# Patient Record
Sex: Female | Born: 1990 | Race: White | Hispanic: No | Marital: Married | Smoking: Former smoker
Health system: Southern US, Community
[De-identification: ages and names within clinical notes are randomized; demographics above are authoritative.]

## PROBLEM LIST (undated history)

## (undated) ENCOUNTER — Inpatient Hospital Stay (HOSPITAL_COMMUNITY): Payer: Self-pay

## (undated) DIAGNOSIS — Z975 Presence of (intrauterine) contraceptive device: Secondary | ICD-10-CM

## (undated) DIAGNOSIS — N926 Irregular menstruation, unspecified: Principal | ICD-10-CM

## (undated) DIAGNOSIS — M329 Systemic lupus erythematosus, unspecified: Secondary | ICD-10-CM

## (undated) DIAGNOSIS — R109 Unspecified abdominal pain: Principal | ICD-10-CM

## (undated) DIAGNOSIS — E7800 Pure hypercholesterolemia, unspecified: Secondary | ICD-10-CM

## (undated) DIAGNOSIS — M069 Rheumatoid arthritis, unspecified: Secondary | ICD-10-CM

## (undated) DIAGNOSIS — Z3490 Encounter for supervision of normal pregnancy, unspecified, unspecified trimester: Principal | ICD-10-CM

## (undated) DIAGNOSIS — K5900 Constipation, unspecified: Secondary | ICD-10-CM

## (undated) DIAGNOSIS — K219 Gastro-esophageal reflux disease without esophagitis: Secondary | ICD-10-CM

## (undated) DIAGNOSIS — M1990 Unspecified osteoarthritis, unspecified site: Secondary | ICD-10-CM

## (undated) DIAGNOSIS — IMO0002 Reserved for concepts with insufficient information to code with codable children: Secondary | ICD-10-CM

## (undated) HISTORY — DX: Constipation, unspecified: K59.00

## (undated) HISTORY — DX: Encounter for supervision of normal pregnancy, unspecified, unspecified trimester: Z34.90

## (undated) HISTORY — DX: Presence of (intrauterine) contraceptive device: Z97.5

## (undated) HISTORY — DX: Unspecified abdominal pain: R10.9

## (undated) HISTORY — DX: Irregular menstruation, unspecified: N92.6

## (undated) HISTORY — DX: Reserved for concepts with insufficient information to code with codable children: IMO0002

## (undated) HISTORY — DX: Gastro-esophageal reflux disease without esophagitis: K21.9

## (undated) HISTORY — DX: Unspecified osteoarthritis, unspecified site: M19.90

## (undated) HISTORY — DX: Pure hypercholesterolemia, unspecified: E78.00

## (undated) HISTORY — DX: Systemic lupus erythematosus, unspecified: M32.9

## (undated) HISTORY — DX: Rheumatoid arthritis, unspecified: M06.9

---

## 2010-04-17 ENCOUNTER — Ambulatory Visit (HOSPITAL_COMMUNITY): Admission: RE | Admit: 2010-04-17 | Discharge: 2010-04-17 | Payer: Self-pay | Admitting: Family Medicine

## 2010-12-22 ENCOUNTER — Encounter: Payer: Self-pay | Admitting: Internal Medicine

## 2011-04-16 ENCOUNTER — Other Ambulatory Visit: Payer: Self-pay | Admitting: Obstetrics & Gynecology

## 2011-04-16 DIAGNOSIS — N644 Mastodynia: Secondary | ICD-10-CM

## 2011-04-16 DIAGNOSIS — N6320 Unspecified lump in the left breast, unspecified quadrant: Secondary | ICD-10-CM

## 2011-04-23 ENCOUNTER — Ambulatory Visit (HOSPITAL_COMMUNITY)
Admission: RE | Admit: 2011-04-23 | Discharge: 2011-04-23 | Disposition: A | Discharge status: Home / Self Care | Payer: BC Managed Care – PPO | Source: Ambulatory Visit | Attending: Obstetrics & Gynecology | Admitting: Obstetrics & Gynecology

## 2011-04-23 DIAGNOSIS — N644 Mastodynia: Secondary | ICD-10-CM

## 2011-04-23 DIAGNOSIS — N6320 Unspecified lump in the left breast, unspecified quadrant: Secondary | ICD-10-CM

## 2011-04-23 DIAGNOSIS — N63 Unspecified lump in unspecified breast: Secondary | ICD-10-CM | POA: Insufficient documentation

## 2011-10-13 ENCOUNTER — Encounter (HOSPITAL_COMMUNITY): Payer: Self-pay | Admitting: Pharmacy Technician

## 2011-10-13 ENCOUNTER — Encounter (HOSPITAL_COMMUNITY): Payer: Self-pay

## 2011-10-13 ENCOUNTER — Encounter (HOSPITAL_COMMUNITY)
Admission: RE | Admit: 2011-10-13 | Discharge: 2011-10-13 | Disposition: A | Discharge status: Home / Self Care | Payer: BC Managed Care – PPO | Source: Ambulatory Visit | Attending: General Surgery | Admitting: General Surgery

## 2011-10-13 LAB — SURGICAL PCR SCREEN: Staphylococcus aureus: NEGATIVE

## 2011-10-13 LAB — CBC
Hemoglobin: 12.6 g/dL (ref 12.0–15.0)
MCH: 28.8 pg (ref 26.0–34.0)
Platelets: 243 10*3/uL (ref 150–400)
RBC: 4.37 MIL/uL (ref 3.87–5.11)
RDW: 12.5 % (ref 11.5–15.5)

## 2011-10-13 LAB — HCG, QUANTITATIVE, PREGNANCY: hCG, Beta Chain, Quant, S: <1 m[IU]/mL (ref <5)

## 2011-10-13 LAB — BASIC METABOLIC PANEL
BUN: 9 mg/dL (ref 6–23)
CO2: 27 mEq/L (ref 19–32)
Calcium: 10.2 mg/dL (ref 8.4–10.5)
Creatinine, Ser: 0.77 mg/dL (ref 0.50–1.10)
Sodium: 137 mEq/L (ref 135–145)

## 2011-10-13 LAB — DIFFERENTIAL
Basophils Absolute: 0 10*3/uL (ref 0.0–0.1)
Eosinophils Absolute: 0.1 10*3/uL (ref 0.0–0.7)
Eosinophils Relative: 2 % (ref 0–5)
Lymphs Abs: 2 10*3/uL (ref 0.7–4.0)
Monocytes Absolute: 0.4 10*3/uL (ref 0.1–1.0)
Monocytes Relative: 8 % (ref 3–12)

## 2011-10-13 NOTE — Patient Instructions (Addendum)
20 Sydney Trujillo  10/13/2011   Your procedure is scheduled on:  10/17/2011  Report to [facility] at  615  AM.  Call this number if you have problems the morning of surgery: [phone]   Remember:   Do not eat food:After Midnight.  Do not drink clear liquids: After Midnight.  Take these medicines the morning of surgery with A SIP OF WATER: none   Do not wear jewelry, make-up or nail polish.  Do not wear lotions, powders, or perfumes. You may wear deodorant.  Do not shave 48 hours prior to surgery.  Do not bring valuables to the hospital.  Contacts, dentures or bridgework may not be worn into surgery.  Leave suitcase in the car. After surgery it may be brought to your room.  For patients admitted to the hospital, checkout time is 11:00 AM the day of discharge.   Patients discharged the day of surgery will not be allowed to drive home.  Name and phone number of your driver: family  Special Instructions: CHG Shower Use Special Wash: 1/2 bottle night before surgery and 1/2 bottle morning of surgery.   Please read over the following fact sheets that you were given: Pain Booklet, MRSA Information, Surgical Site Infection Prevention, Anesthesia Post-op Instructions and Care and Recovery After Surgery Breast Biopsy WHY YOU NEED A BIOPSY Your caregiver has recommended that you have a breast tissue sample taken (biopsy). This is done to be certain that the lump or abnormality found in your breast is not cancerous (malignant). During a biopsy, a small piece of tissue is removed, so it can be examined under a microscope by a specialist (pathologist) who looks at tissues and cells and diagnoses abnormalities in them. Most lumps (tumors) or abnormalities, on or in the breast, are not cancerous (benign). However, biopsies are taken when your caregiver cannot be absolutely certain of what is wrong only from doing a physical exam, mammogram (breast X-ray), or other studies. A breast biopsy can tell you  whether nothing more needs to be done, or you need more surgery or another type of treatment. A biopsy is done when there is:  Any undiagnosed breast mass.   Nipple abnormalities, dimpling, crusting, or ulcerations.   Calcium deposits (calcifications) or abnormalities seen on your mammogram, ultrasound, or MRI.   Suspicious changes in the breast (thickening, asymmetry) seen on mammogram.   Abnormal discharge from the nipple, especially blood.   Redness, swelling, and pain of the breast.  HOW A BIOPSY IS PERFORMED A biopsy is often performed on an outpatient basis (you go home the same day). This can be done in a hospital, clinic, or surgical center. Tissue samples (biopsies) are often done under local anesthesia (area is numbed). Sometimes general anesthetics are required, in which case you sleep through the procedure. Biopsies may remove the entire lump, a small piece of the lump, or a small sliver of tissue removed by needle. TYPES OF BREAST BIOPSY  Fine needle aspiration. A thin needle is placed through the skin, to the lump or cyst, and cells are removed.   Core needle biopsy. A large needle with a special tip is placed through the skin, to the abnormality, and a piece of tissue is removed.   Stereotactic biopsy. A core needle with a special X-ray is used, to direct the needle to the lump or abnormal area, which is difficult to feel or cannot be felt.   Vacuum-assisted biopsy. A hollow probe and a gentle vacuum remove a  sample of tissue.   Ultrasound guided core needle biopsy. You lie on your stomach, with your breast through an opening, and a high frequency ultrasound helps guide the needle to the area of the abnormality.   Open biopsy. An incision is made in the breast, and a piece of the lump or the whole lump is removed.  LET YOUR CAREGIVER KNOW ABOUT:  Allergies.   Medicines taken, including herbs, eye drops, over-the-counter medicines, and creams.   Use of steroids (by  mouth or creams).   Previous problems with anesthetics or Novocaine.   If you are taking aspirin or blood thinners.   Possibility of pregnancy, if this applies.   History of blood clots (thrombophlebitis).   History of bleeding or blood problems.   Previous surgery.   Other health problems.  RISKS AND COMPLICATIONS   Bleeding.   Infection.   Allergy to medicines.   Bruising and swelling of the breast.   Alteration in the shape of the breast.   Not finding the lump or abnormality.   Needing more surgery.  BEFORE THE PROCEDURE  You should arrive 60 minutes prior to your procedure or as directed.   Check-in at the admissions desk, to fill out necessary forms, if you are not preregistered.   There will be consent forms to sign, prior to the procedure.   There is a waiting area for your family, while you are having your biopsy.   Try to have someone with you, to drive you home.   Do not smoke for 2 weeks before the surgery.   Let your caregiver know if you develop a cold or an infection.   Do not drink alcohol for at least 24 hours before surgery.   Wear a good support bra to the surgery.  AFTER THE PROCEDURE  After surgery, you will be taken to the recovery area, where a nurse will watch and check your progress. Once you are awake, stable, and taking fluids well, if there are no other problems, you will be allowed to go home.   Ice packs applied to your operative site may help with discomfort and keep the swelling down.   You may resume normal diet and activities as directed. Avoid strenuous activities affecting the arm on the side of the biopsy, such as tennis, swimming, heavy lifting (more than 10 pounds) or pulling.   Bruising in the breast is normal following this procedure.   Wearing a support bra, even to bed, may be more comfortable. The bra will also help keep the dressing on.   Change dressings as directed.   Your doctor may apply a pressure  dressing on your breast for 24 to 48 hours.   Only take over-the-counter or prescription medicines for pain, discomfort, or fever as directed by your caregiver.   Do not take aspirin, because it can cause bleeding.  HOME CARE INSTRUCTIONS   You may resume your usual diet.   Have someone drive you home after the surgery.   Do not do any exercise, driving, lifting or general activities without your caregiver's permission.   Take medicines and over-the-counter medicines, as ordered by your caregiver.   Keep your postoperative appointments as recommended.   Do not drink alcohol while taking pain medicine.  Finding out the results of your test Not all test results are available during your visit. If your test results are not back during the visit, make an appointment with your caregiver to find out the results. Do not  assume everything is normal if you have not heard from your caregiver or the medical facility. It is important for you to follow up on all of your test results.  SEEK MEDICAL CARE IF:   You notice redness, swelling, or increasing pain in the wound.   You notice a bad smell coming from the wound or dressing.   You develop a rash.   You need stronger pain medicine.   You are having an allergic reaction or problems with your medicines.  SEEK IMMEDIATE MEDICAL CARE IF:   You have difficulty breathing.   You have a fever.   There is increased bleeding (more than a small spot) from the wound.   Pus is coming from the wound.   The wound is breaking open.  Document Released: 11/17/2005 Document Revised: 07/30/2011 Document Reviewed: 10/05/2009 [facility] Patient Information 2012 [place], [place].PATIENT INSTRUCTIONS POST-ANESTHESIA  IMMEDIATELY FOLLOWING SURGERY:  Do not drive or operate machinery for the first twenty four hours after surgery.  Do not make any important decisions for twenty four hours after surgery or while taking narcotic pain medications or sedatives.   If you develop intractable nausea and vomiting or a severe headache please notify your doctor immediately.  FOLLOW-UP:  Please make an appointment with your surgeon as instructed. You do not need to follow up with anesthesia unless specifically instructed to do so.  WOUND CARE INSTRUCTIONS (if applicable):  Keep a dry clean dressing on the anesthesia/puncture wound site if there is drainage.  Once the wound has quit draining you may leave it open to air.  Generally you should leave the bandage intact for twenty four hours unless there is drainage.  If the epidural site drains for more than 36-48 hours please call the anesthesia department.  QUESTIONS?:  Please feel free to call your physician or the hospital operator if you have any questions, and they will be happy to assist you.     [facility] Anesthesia Department [address] [place] [phone]

## 2011-10-16 ENCOUNTER — Encounter (HOSPITAL_COMMUNITY): Payer: Self-pay | Admitting: Pharmacy Technician

## 2011-10-16 NOTE — H&P (Signed)
  NTS SOAP Note  Vital Signs:  Vitals as of: 09/18/2011: Systolic 130: Diastolic 83: Heart Rate 84: Temp 99.30F: Height 61ft 2in: Weight 145Lbs 0 Ounces: Pain Level 0: BMI 27  BMI : 26.52 kg/m2  Subjective: This 20 Years 7 Months old Female presents for of Left breast mass  Patient has noted a mass within the left breast over the last several months. This has increased somewhat in size. She occasionally has discomfort in this area. She has not had any nipple discharge or skin changes. No other nodules or abnormalities are noted in the left or right breast. She's not had any fevers or chills. No weight changes. She has undergone both mammography and ultrasound of the breast. She remains concerned due to the size change.  Review of Symptoms:  Constitutional:unremarkable Head:unremarkable Eyes:unremarkable Nose/Mouth/Throat:unremarkable Cardiovascular:unremarkable Respiratory:unremarkable Gastrointestinal:unremarkable Genitourinary:unremarkable Musculoskeletal:unremarkable Skin:unremarkable As per history of present illness Hematolgic/Lymphatic:unremarkable Allergic/Immunologic:unremarkable   Past Medical History:Obtained   Past Medical History  Pregnancy Gravida: 0 Pregnancy Para: 0 Surgical History: None Medical Problems: none Psychiatric History: none Allergies: nkda Medications: none   Social History:Obtained   Social History  Preferred Language: English ([place]) Race:  White Ethnicity: Not Hispanic / Latino Age: 20 Years 7 Months Marital Status:  M Alcohol:  No Recreational drug(s):  No   Smoking Status: Never smoker reviewed on 09/21/2011  Family History:Obtained   Family History  Is there a family history JY:NWGNFAO does have an aunt with a history of breast cancer at the age of 44. No ovarian or uterine cancers in the family.    Objective Information: General:Well appearing, well nourished in no  distress. Skin:no rash or prominent lesions Head:Atraumatic; no masses; no abnormalities Eyes:conjunctiva clear, EOM intact, PERRL Mouth:Mucous membranes moist, no mucosal lesions. Neck:Supple without lymphadenopathy.  Heart:RRR, no murmur Lungs:CTA bilaterally, no wheezes, rhonchi, rales.  Breathing unlabored. Right breast unremarkable, left breast there is a palpable mobile mildly tender mass at the 7:00 position no nipple discharge or skin changes. No axillary lymphadenopathy Abdomen:Soft, NT/ND, no HSM, no masses. Extremities:No deformities, clubbing, cyanosis, or edema.   Assessment:  Diagnosis &amp; Procedure: DiagnosisCode: 611.72, ProcedureCode: 13086,    Plan: Options were discussed at length the patient. Patient does wish to proceed with an excisional biopsy. Risks benefits and alternatives were discussed. We will plan to proceed at her earliest convenience.  Patient Education:Alternative treatments to surgery were discussed with patient (and family).Risks and benefits  of procedure were fully explained to the patient (and family) who gave informed consent. Patient/family questions were addressed.  Follow-up:Pending Surgery

## 2011-10-17 ENCOUNTER — Encounter (HOSPITAL_COMMUNITY): Payer: Self-pay | Admitting: *Deleted

## 2011-10-17 ENCOUNTER — Encounter (HOSPITAL_COMMUNITY): Payer: Self-pay | Admitting: Anesthesiology

## 2011-10-17 ENCOUNTER — Other Ambulatory Visit: Payer: Self-pay | Admitting: General Surgery

## 2011-10-17 ENCOUNTER — Ambulatory Visit (HOSPITAL_COMMUNITY): Payer: BC Managed Care – PPO | Admitting: Anesthesiology

## 2011-10-17 ENCOUNTER — Encounter (HOSPITAL_COMMUNITY)
Admission: RE | Disposition: A | Discharge status: Home / Self Care | Payer: Self-pay | Source: Ambulatory Visit | Attending: General Surgery

## 2011-10-17 ENCOUNTER — Ambulatory Visit (HOSPITAL_COMMUNITY)
Admission: RE | Admit: 2011-10-17 | Discharge: 2011-10-17 | Disposition: A | Discharge status: Home / Self Care | Payer: BC Managed Care – PPO | Source: Ambulatory Visit | Attending: General Surgery | Admitting: General Surgery

## 2011-10-17 DIAGNOSIS — Z01812 Encounter for preprocedural laboratory examination: Secondary | ICD-10-CM | POA: Insufficient documentation

## 2011-10-17 DIAGNOSIS — N6320 Unspecified lump in the left breast, unspecified quadrant: Secondary | ICD-10-CM

## 2011-10-17 DIAGNOSIS — N6009 Solitary cyst of unspecified breast: Secondary | ICD-10-CM | POA: Insufficient documentation

## 2011-10-17 DIAGNOSIS — N6029 Fibroadenosis of unspecified breast: Secondary | ICD-10-CM | POA: Insufficient documentation

## 2011-10-17 DIAGNOSIS — D249 Benign neoplasm of unspecified breast: Secondary | ICD-10-CM | POA: Insufficient documentation

## 2011-10-17 HISTORY — PX: BREAST BIOPSY: SHX20

## 2011-10-17 SURGERY — BREAST BIOPSY
Anesthesia: General | Site: Breast | Laterality: Left | Wound class: Clean

## 2011-10-17 MED ORDER — LIDOCAINE HCL (PF) 1 % IJ SOLN
INTRAMUSCULAR | Status: AC
  Filled 2011-10-17: qty 5

## 2011-10-17 MED ORDER — PROPOFOL 10 MG/ML IV EMUL
INTRAVENOUS | Status: DC | PRN
  Administered 2011-10-17: 20 mg via INTRAVENOUS
  Administered 2011-10-17: 30 mg via INTRAVENOUS
  Administered 2011-10-17: 150 mg via INTRAVENOUS

## 2011-10-17 MED ORDER — PROPOFOL 10 MG/ML IV EMUL
INTRAVENOUS | Status: AC
  Filled 2011-10-17: qty 20

## 2011-10-17 MED ORDER — CEFAZOLIN SODIUM 1-5 GM-% IV SOLN
1.0000 g | INTRAVENOUS | Status: AC
  Administered 2011-10-17: 1 g via INTRAVENOUS

## 2011-10-17 MED ORDER — MIDAZOLAM HCL 2 MG/2ML IJ SOLN
1.0000 mg | INTRAMUSCULAR | Status: DC | PRN
  Administered 2011-10-17: 2 mg via INTRAVENOUS

## 2011-10-17 MED ORDER — GLYCOPYRROLATE 0.2 MG/ML IJ SOLN
0.2000 mg | Freq: Once | INTRAMUSCULAR | Status: AC | PRN
  Administered 2011-10-17: 0.2 mg via INTRAVENOUS

## 2011-10-17 MED ORDER — ENOXAPARIN SODIUM 40 MG/0.4ML ~~LOC~~ SOLN
40.0000 mg | Freq: Once | SUBCUTANEOUS | Status: AC
  Administered 2011-10-17: 40 mg via SUBCUTANEOUS

## 2011-10-17 MED ORDER — ENOXAPARIN SODIUM 40 MG/0.4ML ~~LOC~~ SOLN
SUBCUTANEOUS | Status: AC
  Administered 2011-10-17: 40 mg via SUBCUTANEOUS
  Filled 2011-10-17: qty 0.4

## 2011-10-17 MED ORDER — SODIUM CHLORIDE 0.9 % IR SOLN
Status: DC | PRN
  Administered 2011-10-17: 1000 mL

## 2011-10-17 MED ORDER — LACTATED RINGERS IV SOLN
INTRAVENOUS | Status: DC
Start: 2011-10-17 — End: 2011-10-17
  Administered 2011-10-17: 1000 mL via INTRAVENOUS

## 2011-10-17 MED ORDER — FENTANYL CITRATE 0.05 MG/ML IJ SOLN
INTRAMUSCULAR | Status: DC | PRN
  Administered 2011-10-17 (×2): 50 ug via INTRAVENOUS
  Administered 2011-10-17: 25 ug via INTRAVENOUS

## 2011-10-17 MED ORDER — FENTANYL CITRATE 0.05 MG/ML IJ SOLN
INTRAMUSCULAR | Status: AC
  Filled 2011-10-17: qty 2

## 2011-10-17 MED ORDER — ONDANSETRON HCL 4 MG/2ML IJ SOLN
4.0000 mg | Freq: Once | INTRAMUSCULAR | Status: AC
  Administered 2011-10-17: 4 mg via INTRAVENOUS

## 2011-10-17 MED ORDER — FENTANYL CITRATE 0.05 MG/ML IJ SOLN: 25.0000 ug | INTRAMUSCULAR | Status: DC | PRN

## 2011-10-17 MED ORDER — ACETAMINOPHEN 325 MG PO TABS: 325.0000 mg | ORAL_TABLET | ORAL | Status: DC | PRN

## 2011-10-17 MED ORDER — ONDANSETRON HCL 4 MG/2ML IJ SOLN: 4.0000 mg | Freq: Once | INTRAMUSCULAR | Status: DC | PRN

## 2011-10-17 MED ORDER — BUPIVACAINE HCL (PF) 0.5 % IJ SOLN
INTRAMUSCULAR | Status: DC | PRN
  Administered 2011-10-17: 10 mL

## 2011-10-17 MED ORDER — GLYCOPYRROLATE 0.2 MG/ML IJ SOLN
INTRAMUSCULAR | Status: AC
  Administered 2011-10-17: 0.2 mg via INTRAVENOUS
  Filled 2011-10-17: qty 1

## 2011-10-17 MED ORDER — MIDAZOLAM HCL 2 MG/2ML IJ SOLN
INTRAMUSCULAR | Status: AC
  Administered 2011-10-17: 2 mg via INTRAVENOUS
  Filled 2011-10-17: qty 2

## 2011-10-17 MED ORDER — BUPIVACAINE HCL (PF) 0.5 % IJ SOLN
INTRAMUSCULAR | Status: AC
  Filled 2011-10-17: qty 30

## 2011-10-17 MED ORDER — CELECOXIB 100 MG PO CAPS: 400.0000 mg | ORAL_CAPSULE | Freq: Every day | ORAL | Status: DC

## 2011-10-17 MED ORDER — CEFAZOLIN SODIUM 1-5 GM-% IV SOLN
INTRAVENOUS | Status: AC
  Filled 2011-10-17: qty 50

## 2011-10-17 MED ORDER — ONDANSETRON HCL 4 MG/2ML IJ SOLN
INTRAMUSCULAR | Status: AC
  Administered 2011-10-17: 4 mg via INTRAVENOUS
  Filled 2011-10-17: qty 2

## 2011-10-17 MED ORDER — HYDROCODONE-ACETAMINOPHEN 5-325 MG PO TABS: 1.0000 | ORAL_TABLET | ORAL | Status: AC | PRN

## 2011-10-17 SURGICAL SUPPLY — 29 items
BAG HAMPER (MISCELLANEOUS) ×2 IMPLANT
BENZOIN TINCTURE PRP APPL 2/3 (GAUZE/BANDAGES/DRESSINGS) ×2 IMPLANT
CLOTH BEACON ORANGE TIMEOUT ST (SAFETY) ×2 IMPLANT
COVER LIGHT HANDLE STERIS (MISCELLANEOUS) ×4 IMPLANT
DURAPREP 26ML APPLICATOR (WOUND CARE) ×2 IMPLANT
ELECT REM PT RETURN 9FT ADLT (ELECTROSURGICAL) ×2
ELECTRODE REM PT RTRN 9FT ADLT (ELECTROSURGICAL) ×1 IMPLANT
FORMALIN 10 PREFIL 120ML (MISCELLANEOUS) ×2 IMPLANT
GLOVE BIOGEL PI IND STRL 7.5 (GLOVE) ×1 IMPLANT
GLOVE BIOGEL PI INDICATOR 7.5 (GLOVE) ×1
GLOVE ECLIPSE 6.5 STRL STRAW (GLOVE) ×2 IMPLANT
GLOVE ECLIPSE 7.0 STRL STRAW (GLOVE) ×2 IMPLANT
GLOVE INDICATOR 7.0 STRL GRN (GLOVE) ×2 IMPLANT
GOWN STRL REIN XL XLG (GOWN DISPOSABLE) ×4 IMPLANT
KIT ROOM TURNOVER APOR (KITS) ×2 IMPLANT
MANIFOLD NEPTUNE II (INSTRUMENTS) ×2 IMPLANT
NEEDLE HYPO 18GX1.5 BLUNT FILL (NEEDLE) IMPLANT
NEEDLE HYPO 25X1 1.5 SAFETY (NEEDLE) ×2 IMPLANT
NS IRRIG 1000ML POUR BTL (IV SOLUTION) ×2 IMPLANT
PACK MINOR (CUSTOM PROCEDURE TRAY) ×2 IMPLANT
PAD ARMBOARD 7.5X6 YLW CONV (MISCELLANEOUS) ×2 IMPLANT
SET BASIN LINEN APH (SET/KITS/TRAYS/PACK) ×2 IMPLANT
SPONGE GAUZE 2X2 8PLY STRL LF (GAUZE/BANDAGES/DRESSINGS) IMPLANT
STRIP CLOSURE SKIN 1/2X4 (GAUZE/BANDAGES/DRESSINGS) ×2 IMPLANT
SUT MNCRL AB 4-0 PS2 18 (SUTURE) ×2 IMPLANT
SUT VIC AB 3-0 SH 27 (SUTURE) ×1
SUT VIC AB 3-0 SH 27X BRD (SUTURE) ×1 IMPLANT
SYR BULB IRRIGATION 50ML (SYRINGE) ×2 IMPLANT
SYR CONTROL 10ML LL (SYRINGE) ×2 IMPLANT

## 2011-10-17 NOTE — Anesthesia Preprocedure Evaluation (Signed)
Anesthesia Evaluation  Patient identified by MRN, date of birth, ID band Patient awake    Reviewed: Allergy & Precautions, H&P , NPO status , Patient's Chart, lab work & pertinent test results  Airway       Dental   Pulmonary neg pulmonary ROS,          Cardiovascular neg cardio ROS     Neuro/Psych Negative Neurological ROS  Negative Psych ROS   GI/Hepatic negative GI ROS, Neg liver ROS,   Endo/Other  Negative Endocrine ROS  Renal/GU negative Renal ROS     Musculoskeletal negative musculoskeletal ROS (+)   Abdominal   Peds  Hematology negative hematology ROS (+)   Anesthesia Other Findings   Reproductive/Obstetrics negative OB ROS                           Anesthesia Physical Anesthesia Plan  ASA: I  Anesthesia Plan: General   Post-op Pain Management:    Induction: Intravenous  Airway Management Planned: LMA  Additional Equipment:   Intra-op Plan:   Post-operative Plan: Extubation in OR  Informed Consent: I have reviewed the patients History and Physical, chart, labs and discussed the procedure including the risks, benefits and alternatives for the proposed anesthesia with the patient or authorized representative who has indicated his/her understanding and acceptance.     Plan Discussed with: CRNA  Anesthesia Plan Comments:         Anesthesia Quick Evaluation

## 2011-10-17 NOTE — Anesthesia Procedure Notes (Signed)
Procedure Name: LMA Insertion Date/Time: 10/17/2011 8:05 AM Performed by: Minerva Areola Pre-anesthesia Checklist: Patient identified, Patient being monitored, Emergency Drugs available, Timeout performed and Suction available Patient Re-evaluated:Patient Re-evaluated prior to inductionOxygen Delivery Method: Circle System Utilized Preoxygenation: Pre-oxygenation with 100% oxygen Intubation Type: IV induction Ventilation: Mask ventilation without difficulty LMA: LMA inserted LMA Size: 3.0 Number of attempts: 1 Placement Confirmation: positive ETCO2 and breath sounds checked- equal and bilateral

## 2011-10-17 NOTE — Anesthesia Postprocedure Evaluation (Signed)
Anesthesia Post Note  Patient: Sydney Trujillo  Procedure(s) Performed:  BREAST BIOPSY  Anesthesia type: General  Patient location: PACU  Post pain: Pain level controlled  Post assessment: Post-op Vital signs reviewed, Patient's Cardiovascular Status Stable, Respiratory Function Stable, Patent Airway, No signs of Nausea or vomiting and Pain level controlled  Last Vitals:  Filed Vitals:   10/17/11 0853  BP: 122/59  Pulse: 90  Temp: 36.8 C  Resp: 12    Post vital signs: Reviewed and stable  Level of consciousness: awake and alert   Complications: No apparent anesthesia complications

## 2011-10-17 NOTE — Op Note (Signed)
..  Patient:  Sydney Trujillo  DOB:  1991-09-22  MRN:  [id number]   Preop Diagnosis:  Left breast mass  Postop Diagnosis:  The same  Procedure:  Left breast excisional biopsy  Surgeon:  Dr. Tilford Pillar  Anes:  General endotracheal  Indications:  Patient is a 20 year old female presented to my office with a history of a left breast mass. This had slowly increased in size. Due to the changes patient has been concerned about this mass and discussion about an excisional biopsy was undertaken. Risks benefits alternatives of biopsy were discussed including but not limited to risk of bleeding, infection, skin dehiscence as well as possible need for additional surgeries. Her questions and concerns were addressed the patient was consented for the planned procedure.  Procedure note:  Patient is taken to the or was placed in supine position on the or table. At this point the general anesthetic was administered and the patient was endotracheally intubated by the nurse anesthetist. At this point her left breast was prepped with DuraPrep solution and draped in standard fashion. A semilunar incision was created along the medial and inferior aspect of the areola complex. Additional dissection down to subcuticular tissues carried out using electrocautery. This dissection was carried out down to the palpable mass. An Allis clamp was utilized to grasp the mass and with this up to the field. A letter cautery was used to circumferentially dissect around the palpable mass. Once free was placed on the back table and sent as a permanent specimen to pathology. At this point hemostasis was excellent having been attainable electrocautery. The wound is irrigated with sterile saline. A 3-0 Vicryl was utilized to reapproximate the deep subcuticular tissue. Local anesthetic was instilled. A 4-0 Monocryl was utilized reapproximate the skin edges in a running subcuticular suture. Skin was washed dried moist dry towel. Benzoin is  applied around incision. Half-inch are suture placed. The drapes removed the patient was allowed to come out of general anesthetic was transferred back to the postanesthetic care unit in stable condition. At the conclusion of procedure all instrument, sponge, needle counts are correct. Patient tolerated procedure well.  Complications:  None  EBL:  Minimal  Specimen: Breast mass

## 2011-10-17 NOTE — Transfer of Care (Signed)
Immediate Anesthesia Transfer of Care Note  Patient: Sydney Trujillo  Procedure(s) Performed:  BREAST BIOPSY  Patient Location: PACU  Anesthesia Type: General  Level of Consciousness: awake  Airway & Oxygen Therapy: Patient Spontanous Breathing and non-rebreather face mask  Post-op Assessment: Report given to PACU RN, Post -op Vital signs reviewed and stable and Patient moving all extremities  Post vital signs: Reviewed and stable  Complications: No apparent anesthesia complications

## 2011-10-17 NOTE — Interval H&P Note (Signed)
History and Physical Interval Note:   10/17/2011   7:55 AM   Sydney Trujillo  has presented today for surgery, with the diagnosis of Breast mass [611.72]  The various methods of treatment have been discussed with the patient and family. After consideration of risks, benefits and other options for treatment, the patient has consented to  Procedure(s): BREAST BIOPSY as a surgical intervention .  The patients' history has been reviewed, patient examined, no change in status, stable for surgery.  I have reviewed the patients' chart and labs.  Questions were answered to the patient's satisfaction.     Fabio Bering  MD

## 2011-10-22 ENCOUNTER — Encounter (HOSPITAL_COMMUNITY): Payer: Self-pay | Admitting: General Surgery

## 2013-03-03 ENCOUNTER — Telehealth: Payer: Self-pay | Admitting: Adult Health

## 2013-03-03 NOTE — Telephone Encounter (Signed)
Pt states now taking megace 1 tablet a day, but continues to have bleeding. Please advise.

## 2013-03-03 NOTE — Telephone Encounter (Signed)
Left voice mail on cell to increase Megace to 2 a day

## 2013-08-08 ENCOUNTER — Ambulatory Visit (HOSPITAL_COMMUNITY)
Admission: RE | Admit: 2013-08-08 | Discharge: 2013-08-08 | Disposition: A | Discharge status: Home / Self Care | Payer: BC Managed Care – PPO | Source: Ambulatory Visit | Attending: Family Medicine | Admitting: Family Medicine

## 2013-08-08 ENCOUNTER — Other Ambulatory Visit (HOSPITAL_COMMUNITY): Payer: Self-pay | Admitting: Family Medicine

## 2013-08-08 DIAGNOSIS — R059 Cough, unspecified: Secondary | ICD-10-CM | POA: Insufficient documentation

## 2013-08-08 DIAGNOSIS — J069 Acute upper respiratory infection, unspecified: Secondary | ICD-10-CM

## 2013-08-08 DIAGNOSIS — R05 Cough: Secondary | ICD-10-CM | POA: Insufficient documentation

## 2013-08-25 ENCOUNTER — Telehealth: Payer: Self-pay | Admitting: *Deleted

## 2013-08-25 NOTE — Telephone Encounter (Signed)
Bleeding after sex for several years. Call transferred to front staff for an appt to be made with Cyril Mourning, NP

## 2013-08-30 ENCOUNTER — Encounter: Payer: Self-pay | Admitting: Adult Health

## 2013-08-30 ENCOUNTER — Ambulatory Visit (INDEPENDENT_AMBULATORY_CARE_PROVIDER_SITE_OTHER): Payer: BC Managed Care – PPO | Admitting: Adult Health

## 2013-08-30 VITALS — BP 130/80 | Ht 62.0 in | Wt 161.0 lb

## 2013-08-30 DIAGNOSIS — N926 Irregular menstruation, unspecified: Secondary | ICD-10-CM | POA: Insufficient documentation

## 2013-08-30 HISTORY — DX: Irregular menstruation, unspecified: N92.6

## 2013-08-30 LAB — CBC
Hemoglobin: 12.7 g/dL (ref 12.0–15.0)
MCH: 28.4 pg (ref 26.0–34.0)
MCV: 84.6 fL (ref 78.0–100.0)
RBC: 4.47 MIL/uL (ref 3.87–5.11)
WBC: 5.8 10*3/uL (ref 4.0–10.5)

## 2013-08-30 LAB — COMPREHENSIVE METABOLIC PANEL
CO2: 28 mEq/L (ref 19–32)
Calcium: 9.8 mg/dL (ref 8.4–10.5)
Chloride: 103 mEq/L (ref 96–112)
Glucose, Bld: 85 mg/dL (ref 70–99)
Sodium: 139 mEq/L (ref 135–145)
Total Bilirubin: 0.4 mg/dL (ref 0.3–1.2)
Total Protein: 7.1 g/dL (ref 6.0–8.3)

## 2013-08-30 MED ORDER — NORETHIN ACE-ETH ESTRAD-FE 1-20 MG-MCG(24) PO CHEW: 1.0000 | CHEWABLE_TABLET | Freq: Every day | ORAL | Status: DC

## 2013-08-30 NOTE — Progress Notes (Signed)
Subjective:     Patient ID: Sydney Trujillo, female   DOB: 25-Jun-1991, 22 y.o.   MRN: [id number]  HPI Tucker is in complaining of bleeding after sex and bleeding 3 out of 4 weeks has mirena IUD and she has some cramping.So has had weight gain.Has tried megace and doxycyline and anaprox with out relief.She had irregular bleeding with OCs years ago.   Review of Systems See  HPI Reviewed past medical,surgical, social and family history. Reviewed medications and allergies.     Objective:   Physical Exam BP 130/80  Ht 5\' 2"  (1.575 m)  Wt 161 lb (73.029 kg)  BMI 29.44 kg/m2  LMP 08/08/2013   on exam IUD strings are seen at os and she is a little tender over uterus,no masses felt no adnexal tenderness or mass felt no CMT, will try OCs Assessment:     Irregular bleeding   Plan:     Check CBC,CMP,TSH Start minastrin today 1 pack given Lot #[id number] A exp1/16   Follow up in 4 weeks

## 2013-08-30 NOTE — Patient Instructions (Addendum)
Start pills today Follow up in 4 weeks

## 2013-08-31 ENCOUNTER — Telehealth: Payer: Self-pay | Admitting: Adult Health

## 2013-08-31 LAB — TSH: TSH: 1.797 u[IU]/mL (ref 0.350–4.500)

## 2013-08-31 NOTE — Telephone Encounter (Signed)
Pt aware labs normal  

## 2013-09-06 ENCOUNTER — Telehealth: Payer: Self-pay | Admitting: Obstetrics and Gynecology

## 2013-09-06 NOTE — Telephone Encounter (Signed)
Pt states has mirena and birth control, not bleeding now but having a lot of abdominal and vaginal pain. Call transferred to front staff for an appt to be made.

## 2013-09-07 ENCOUNTER — Ambulatory Visit (INDEPENDENT_AMBULATORY_CARE_PROVIDER_SITE_OTHER): Payer: BC Managed Care – PPO | Admitting: Adult Health

## 2013-09-07 ENCOUNTER — Encounter: Payer: Self-pay | Admitting: Adult Health

## 2013-09-07 VITALS — BP 124/76 | Ht 62.0 in | Wt 164.0 lb

## 2013-09-07 DIAGNOSIS — R109 Unspecified abdominal pain: Secondary | ICD-10-CM

## 2013-09-07 HISTORY — DX: Unspecified abdominal pain: R10.9

## 2013-09-07 LAB — POCT URINALYSIS DIPSTICK
Glucose, UA: NEGATIVE
Leukocytes, UA: NEGATIVE
Nitrite, UA: NEGATIVE

## 2013-09-07 MED ORDER — IBUPROFEN 800 MG PO TABS: 800.0000 mg | ORAL_TABLET | Freq: Three times a day (TID) | ORAL | Status: DC | PRN

## 2013-09-07 NOTE — Patient Instructions (Signed)
Abdominal Pain Abdominal pain can be caused by many things. Your caregiver decides the seriousness of your pain by an examination and possibly blood tests and X-rays. Many cases can be observed and treated at home. Most abdominal pain is not caused by a disease and will probably improve without treatment. However, in many cases, more time must pass before a clear cause of the pain can be found. Before that point, it may not be known if you need more testing, or if hospitalization or surgery is needed. HOME CARE INSTRUCTIONS   Do not take laxatives unless directed by your caregiver.  Take pain medicine only as directed by your caregiver.  Only take over-the-counter or prescription medicines for pain, discomfort, or fever as directed by your caregiver.  Try a clear liquid diet (broth, tea, or water) for as long as directed by your caregiver. Slowly move to a bland diet as tolerated. SEEK IMMEDIATE MEDICAL CARE IF:   The pain does not go away.  You have a fever.  You keep throwing up (vomiting).  The pain is felt only in portions of the abdomen. Pain in the right side could possibly be appendicitis. In an adult, pain in the left lower portion of the abdomen could be colitis or diverticulitis.  You pass bloody or black tarry stools. MAKE SURE YOU:   Understand these instructions.  Will watch your condition.  Will get help right away if you are not doing well or get worse. Document Released: 08/27/2005 Document Revised: 02/09/2012 Document Reviewed: 07/05/2008 [facility] Patient Information 2014 [place], [place]. Follow up as scheduled

## 2013-09-07 NOTE — Progress Notes (Signed)
Subjective:     Patient ID: Sydney Trujillo, female   DOB: 07-Apr-1991, 22 y.o.   MRN: [id number]  HPI Sydney Trujillo is back complaining of abdominal pain and achy in vagina.Has IUD and has had irregular bleeding and started minastrin about 1 week age and has had no bleeding except some after sex.She denies nausea,vomiting,diarrhea or fever.  Review of Systems See HPI Reviewed past medical,surgical, social and family history. Reviewed medications and allergies.     Objective:   Physical Exam BP 124/76  Ht 5\' 2"  (1.575 m)  Wt 164 lb (74.39 kg)  BMI 29.99 kg/m2  LMP 08/08/2013   urine negative,    Skin warm and dry.Pelvic: external genitalia is normal in appearance, vagina: no disharge, cervix:smooth and bulbous,negative CMT. uterus: normal size, shape and contour,  tender, no masses felt, adnexa: no masses or tenderness noted. If this persists will remove IUD Assessment:     Abdominal pain    Plan:     Check CBC with diff,CMP, and ESR Rx Motrin 800 mg #60 1 every 8 hours prn pain with 1 refill Return as scheduled and continue OCs

## 2013-09-08 ENCOUNTER — Telehealth: Payer: Self-pay | Admitting: Adult Health

## 2013-09-08 LAB — COMPREHENSIVE METABOLIC PANEL
ALT: 12 U/L (ref 0–35)
Albumin: 4.3 g/dL (ref 3.5–5.2)
BUN: 10 mg/dL (ref 6–23)
CO2: 26 mEq/L (ref 19–32)
Calcium: 9.6 mg/dL (ref 8.4–10.5)
Chloride: 104 mEq/L (ref 96–112)
Creat: 0.66 mg/dL (ref 0.50–1.10)
Glucose, Bld: 75 mg/dL (ref 70–99)
Potassium: 4.5 mEq/L (ref 3.5–5.3)
Sodium: 136 mEq/L (ref 135–145)
Total Protein: 7 g/dL (ref 6.0–8.3)

## 2013-09-08 LAB — CBC WITH DIFFERENTIAL/PLATELET
Eosinophils Relative: 2 % (ref 0–5)
HCT: 37 % (ref 36.0–46.0)
Hemoglobin: 12.5 g/dL (ref 12.0–15.0)
Lymphocytes Relative: 34 % (ref 12–46)
Lymphs Abs: 2 10*3/uL (ref 0.7–4.0)
MCV: 86.2 fL (ref 78.0–100.0)
Monocytes Absolute: 0.4 10*3/uL (ref 0.1–1.0)
Monocytes Relative: 7 % (ref 3–12)
RBC: 4.29 MIL/uL (ref 3.87–5.11)
RDW: 14 % (ref 11.5–15.5)
WBC: 5.8 10*3/uL (ref 4.0–10.5)

## 2013-09-08 LAB — SEDIMENTATION RATE: Sed Rate: 5 mm/hr (ref 0–22)

## 2013-09-08 NOTE — Telephone Encounter (Signed)
Pt aware of labs  

## 2013-09-27 ENCOUNTER — Ambulatory Visit: Payer: BC Managed Care – PPO | Admitting: Adult Health

## 2013-10-18 ENCOUNTER — Ambulatory Visit (INDEPENDENT_AMBULATORY_CARE_PROVIDER_SITE_OTHER): Payer: BC Managed Care – PPO | Admitting: Adult Health

## 2013-10-18 ENCOUNTER — Encounter (INDEPENDENT_AMBULATORY_CARE_PROVIDER_SITE_OTHER): Payer: Self-pay

## 2013-10-18 ENCOUNTER — Encounter: Payer: Self-pay | Admitting: Adult Health

## 2013-10-18 VITALS — BP 114/64 | Ht 62.0 in | Wt 167.0 lb

## 2013-10-18 DIAGNOSIS — Z975 Presence of (intrauterine) contraceptive device: Secondary | ICD-10-CM

## 2013-10-18 DIAGNOSIS — N926 Irregular menstruation, unspecified: Secondary | ICD-10-CM

## 2013-10-18 HISTORY — DX: Presence of (intrauterine) contraceptive device: Z97.5

## 2013-10-18 MED ORDER — NORETHINDRONE-MESTRANOL 1-50 MG-MCG PO TABS: 1.0000 | ORAL_TABLET | Freq: Every day | ORAL | Status: DC

## 2013-10-18 NOTE — Patient Instructions (Signed)
Start necon 1/50 Follow up in 5 weeks

## 2013-10-18 NOTE — Progress Notes (Signed)
Subjective:     Patient ID: Sydney Trujillo, female   DOB: July 01, 1991, 22 y.o.   MRN: [id number]  HPI Sydney Trujillo is a 22 year old white female in complaining of still having irregular bleeding with IUD and bleeding after sex.Has been on minastrin and may be a little better.  Review of Systems See HPI Reviewed past medical,surgical, social and family history. Reviewed medications and allergies.     Objective:   Physical Exam BP 114/64  Ht 5\' 2"  (1.575 m)  Wt 167 lb (75.751 kg)  BMI 30.54 kg/m2  LMP 10/04/2013   talk only, still bleeding, maybe a little better, discussed with Dr Sydney Trujillo, will try Necon 1/50 as per his suggestion . Assessment:     Irregular bleeding and bleeding after sex IUD in place    Plan:    Follow up in 5 weeks Rx Necon 1/50 1 daily with 3 refills

## 2013-11-17 ENCOUNTER — Telehealth: Payer: Self-pay

## 2013-11-17 ENCOUNTER — Encounter: Payer: Self-pay | Admitting: Obstetrics & Gynecology

## 2013-11-17 ENCOUNTER — Telehealth: Payer: Self-pay | Admitting: Adult Health

## 2013-11-17 ENCOUNTER — Ambulatory Visit (INDEPENDENT_AMBULATORY_CARE_PROVIDER_SITE_OTHER): Payer: BC Managed Care – PPO | Admitting: Obstetrics & Gynecology

## 2013-11-17 ENCOUNTER — Encounter (INDEPENDENT_AMBULATORY_CARE_PROVIDER_SITE_OTHER): Payer: Self-pay

## 2013-11-17 VITALS — BP 138/70 | Wt 168.0 lb

## 2013-11-17 DIAGNOSIS — Z30432 Encounter for removal of intrauterine contraceptive device: Secondary | ICD-10-CM

## 2013-11-17 MED ORDER — NAPROXEN SODIUM 550 MG PO TABS: 550.0000 mg | ORAL_TABLET | Freq: Two times a day (BID) | ORAL | Status: DC

## 2013-11-17 NOTE — Telephone Encounter (Signed)
Complains of pain in uterus area,started last night has tired motrin 800 mg without relief to come in a t 4 pm

## 2013-11-17 NOTE — Telephone Encounter (Signed)
To come in at 4 pm

## 2013-11-17 NOTE — Progress Notes (Signed)
Patient ID: Sydney Trujillo, female   DOB: Apr 02, 1991, 22 y.o.   MRN: [id number] Pt has been having all sorts of problems for several months now related to mirena, always had bleeding troubles IUD removed and will maintain necon 1/50 Follow up in 3 weeks to see how her bleeding and pain is going  Past Medical History  Diagnosis Date  . Irregular bleeding 08/30/2013  . Abdominal pain 09/07/2013  . IUD (intrauterine device) in place 10/18/2013    07/2009 IUD    Past Surgical History  Procedure Laterality Date  . Breast biopsy  10/17/2011    Procedure: BREAST BIOPSY;  Surgeon: Fabio Bering;  Location: [facility];  Service: General;  Laterality: Left;    OB History   Grav Para Term Preterm Abortions TAB SAB Ect Mult Living                  Allergies  Allergen Reactions  . Latex Itching    History   Social History  . Marital Status: Married    Spouse Name: N/A    Number of Children: N/A  . Years of Education: N/A   Social History Main Topics  . Smoking status: Former Smoker -- 0.50 packs/day for 1 years    Types: Cigarettes    Quit date: 10/12/2009  . Smokeless tobacco: Never Used  . Alcohol Use: No  . Drug Use: No  . Sexual Activity: Yes    Birth Control/ Protection: IUD, Pill   Other Topics Concern  . None   Social History Narrative  . None    Family History  Problem Relation Age of Onset  . Anesthesia problems Neg Hx   . Hypotension Neg Hx   . Malignant hyperthermia Neg Hx   . Pseudochol deficiency Neg Hx   . Hypertension Mother   . Diabetes Father   . Hypertension Father

## 2013-11-22 ENCOUNTER — Ambulatory Visit: Payer: BC Managed Care – PPO | Admitting: Adult Health

## 2013-11-22 ENCOUNTER — Telehealth: Payer: Self-pay | Admitting: Obstetrics & Gynecology

## 2013-11-22 NOTE — Telephone Encounter (Signed)
Pt states had IUD removed on 11/17/2013 has been taking OCP for 1 month. Is it normal to have abnormal bleeding? Explained to pt can have irregular bleeding when switching from one birth control to another, continue to monitor if pt begins to have cramping or increased bleeding to call office back, if not keep her scheduled appt for 3 weeks. Pt verbalized understanding.

## 2013-12-08 ENCOUNTER — Ambulatory Visit (INDEPENDENT_AMBULATORY_CARE_PROVIDER_SITE_OTHER): Payer: BC Managed Care – PPO | Admitting: Obstetrics & Gynecology

## 2013-12-08 ENCOUNTER — Encounter: Payer: Self-pay | Admitting: Obstetrics & Gynecology

## 2013-12-08 VITALS — BP 124/80 | Ht 62.0 in | Wt 165.5 lb

## 2013-12-08 DIAGNOSIS — N925 Other specified irregular menstruation: Secondary | ICD-10-CM

## 2013-12-08 DIAGNOSIS — N949 Unspecified condition associated with female genital organs and menstrual cycle: Secondary | ICD-10-CM

## 2013-12-08 DIAGNOSIS — N938 Other specified abnormal uterine and vaginal bleeding: Secondary | ICD-10-CM

## 2013-12-08 NOTE — Progress Notes (Signed)
Patient ID: Sydney Trujillo, female   DOB: 01/02/1991, 23 y.o.   MRN: [id number]  See note below from last visit I removed IUD and started her on Neocon1/50 which has worked Recruitment consultant for her, about to have her first period Follow up 6 months for yearly exam and to maybe lower estrogen on pills   Previous visit: Pt has been having all sorts of problems for several months now related to mirena, always had bleeding troubles IUD removed and will maintain necon 1/50 Follow up in 3 weeks to see how her bleeding and pain is going    Past Medical History   Diagnosis  Date   .  Irregular bleeding  08/30/2013   .  Abdominal pain  09/07/2013   .  IUD (intrauterine device) in place  10/18/2013       07/2009 IUD         Past Surgical History   Procedure  Laterality  Date   .  Breast biopsy    10/17/2011       Procedure: BREAST BIOPSY;  Surgeon: Donato Heinz;  Location: [facility];  Service: General;  Laterality: Left;         OB History     Grav  Para  Term  Preterm  Abortions  TAB  SAB  Ect  Mult  Living                                   Allergies   Allergen  Reactions   .  Latex  Itching         History       Social History   .  Marital Status:  Married       Spouse Name:  N/A       Number of Children:  N/A   .  Years of Education:  N/A       Social History Main Topics   .  Smoking status:  Former Smoker -- 0.50 packs/day for 1 years       Types:  Cigarettes       Quit date:  10/12/2009   .  Smokeless tobacco:  Never Used   .  Alcohol Use:  No   .  Drug Use:  No   .  Sexual Activity:  Yes       Birth Control/ Protection:  IUD, Pill       Other Topics  Concern   .  None       Social History Narrative   .  None         Family History   Problem  Relation  Age of Onset   .  Anesthesia problems  Neg Hx     .  Hypotension  Neg Hx     .  Malignant hyperthermia  Neg Hx     .  Pseudochol deficiency  Neg Hx     .  Hypertension  Mother     .  Diabetes  Father      .  Hypertension  Father

## 2014-02-02 ENCOUNTER — Other Ambulatory Visit: Payer: Self-pay | Admitting: Adult Health

## 2014-05-09 ENCOUNTER — Telehealth: Payer: Self-pay | Admitting: Adult Health

## 2014-05-09 MED ORDER — AZITHROMYCIN 250 MG PO TABS: ORAL_TABLET | ORAL | Status: DC

## 2014-05-09 NOTE — Telephone Encounter (Signed)
Complains of head congestion and sore throat, cleaned out old out building and has had this since, no cough or sputum production, has been taking zyrtec and advil cold and sinus, will rx zpack.Push fluids and rest today if can,call if not better to be seen.

## 2014-08-09 ENCOUNTER — Other Ambulatory Visit: Payer: Self-pay | Admitting: Adult Health

## 2014-09-01 ENCOUNTER — Other Ambulatory Visit: Payer: Self-pay | Admitting: Adult Health

## 2014-09-05 ENCOUNTER — Ambulatory Visit (INDEPENDENT_AMBULATORY_CARE_PROVIDER_SITE_OTHER): Payer: BC Managed Care – PPO | Admitting: Obstetrics & Gynecology

## 2014-09-05 ENCOUNTER — Encounter: Payer: Self-pay | Admitting: Obstetrics & Gynecology

## 2014-09-05 VITALS — BP 120/80 | Wt 157.0 lb

## 2014-09-05 DIAGNOSIS — N926 Irregular menstruation, unspecified: Secondary | ICD-10-CM

## 2014-09-05 MED ORDER — NORETHIN-ETH ESTRAD BIPHASIC 0.5-35/1-35 MG-MCG PO TABS: 1.0000 | ORAL_TABLET | Freq: Every day | ORAL | Status: DC

## 2014-09-05 NOTE — Progress Notes (Signed)
Patient ID: Sydney Trujillo, female   DOB: [birth year], 23 y.o.   MRN: [id number] Will try switching to a 35 microgram pill, same progesterone dropping the EE 6mcg If has trouble will switch back to 50 mic    See note below from last visit  I removed IUD and started her on Neocon1/50 which has worked Recruitment consultant for her, about to have her first period  Follow up 6 months for yearly exam and to maybe lower estrogen on pills  Previous visit:  Pt has been having all sorts of problems for several months now related to mirena, always had bleeding troubles  IUD removed and will maintain necon 1/50  Follow up in 3 weeks to see how her bleeding and pain is going  Past Medical History  Diagnosis Date  . Irregular bleeding 08/30/2013  . Abdominal pain 09/07/2013  . IUD (intrauterine device) in place 10/18/2013  07/2009 IUD  Past Surgical History  Procedure Laterality Date  . Breast biopsy 10/17/2011  Procedure: BREAST BIOPSY; Surgeon: Donato Heinz; Location: [facility]; Service: General; Laterality: Left;  OB History  Grav Para Term Preterm Abortions TAB SAB Ect Mult Living  Allergies  Allergen Reactions  . Latex Itching  History  Social History  . Marital Status: Married  Spouse Name: N/A  Number of Children: N/A  . Years of Education: N/A  Social History Main Topics  . Smoking status: Former Smoker -- 0.50 packs/day for 1 years  Types: Cigarettes  Quit date: 10/12/2009  . Smokeless tobacco: Never Used  . Alcohol Use: No  . Drug Use: No  . Sexual Activity: Yes  Birth Control/ Protection: IUD, Pill  Other Topics Concern  . None  Social History Narrative  . None  Family History  Problem Relation Age of Onset  . Anesthesia problems Neg Hx  . Hypotension Neg Hx  . Malignant hyperthermia Neg Hx  . Pseudochol deficiency Neg Hx  . Hypertension Mother  . Diabetes Father  . Hypertension Father

## 2014-11-15 ENCOUNTER — Telehealth: Payer: Self-pay | Admitting: Adult Health

## 2014-11-15 ENCOUNTER — Other Ambulatory Visit: Payer: BC Managed Care – PPO

## 2014-11-15 DIAGNOSIS — Z3200 Encounter for pregnancy test, result unknown: Secondary | ICD-10-CM

## 2014-11-15 NOTE — Telephone Encounter (Signed)
Spoke with pt letting her know quant was not run as STAT. Should have results tomorrow. Advised to call around 10 am. Pt voiced understanding. [place]

## 2014-11-16 ENCOUNTER — Telehealth: Payer: Self-pay | Admitting: Adult Health

## 2014-11-16 LAB — HCG, QUANTITATIVE, PREGNANCY: hCG, Beta Chain, Quant, S: <2 m[IU]/mL

## 2014-11-16 NOTE — Telephone Encounter (Signed)
Spoke with pt letting her know quant was negative. [place]

## 2014-11-16 NOTE — Telephone Encounter (Signed)
Pt aware QHCG <2

## 2015-01-05 ENCOUNTER — Encounter: Payer: Self-pay | Admitting: *Deleted

## 2015-01-05 ENCOUNTER — Other Ambulatory Visit: Payer: BC Managed Care – PPO | Admitting: Obstetrics & Gynecology

## 2015-01-17 ENCOUNTER — Ambulatory Visit (INDEPENDENT_AMBULATORY_CARE_PROVIDER_SITE_OTHER): Payer: BLUE CROSS/BLUE SHIELD | Admitting: Obstetrics & Gynecology

## 2015-01-17 ENCOUNTER — Other Ambulatory Visit (HOSPITAL_COMMUNITY)
Admission: RE | Admit: 2015-01-17 | Discharge: 2015-01-17 | Disposition: A | Discharge status: Home / Self Care | Payer: BLUE CROSS/BLUE SHIELD | Source: Ambulatory Visit | Attending: Obstetrics & Gynecology | Admitting: Obstetrics & Gynecology

## 2015-01-17 ENCOUNTER — Encounter: Payer: Self-pay | Admitting: Obstetrics & Gynecology

## 2015-01-17 VITALS — BP 122/70 | Ht 62.0 in | Wt 111.0 lb

## 2015-01-17 DIAGNOSIS — Z01419 Encounter for gynecological examination (general) (routine) without abnormal findings: Secondary | ICD-10-CM

## 2015-01-17 DIAGNOSIS — Z01411 Encounter for gynecological examination (general) (routine) with abnormal findings: Secondary | ICD-10-CM | POA: Insufficient documentation

## 2015-01-17 DIAGNOSIS — Z1151 Encounter for screening for human papillomavirus (HPV): Secondary | ICD-10-CM | POA: Diagnosis present

## 2015-01-17 NOTE — Progress Notes (Signed)
Patient ID: Sydney Trujillo, female   DOB: May 18, 1991, 23 y.o.   MRN: [id number] Subjective:     Sydney Trujillo is a 24 y.o. female here for a routine exam.  Patient's last menstrual period was 12/25/2014. No obstetric history on file. Birth Control Method:  none Menstrual Calendar(currently): regular  Current complaints: none.   Current acute medical issues:  none   Recent Gynecologic History Patient's last menstrual period was 12/25/2014. Last Pap: 2012,  normal Last mammogram: 2012,  papilloma  Past Medical History  Diagnosis Date  . Irregular bleeding 08/30/2013  . Abdominal pain 09/07/2013  . IUD (intrauterine device) in place 10/18/2013    07/2009 IUD    Past Surgical History  Procedure Laterality Date  . Breast biopsy  10/17/2011    Procedure: BREAST BIOPSY;  Surgeon: Donato Heinz;  Location: [facility];  Service: General;  Laterality: Left;    OB History    No data available      History   Social History  . Marital Status: Married    Spouse Name: N/A  . Number of Children: N/A  . Years of Education: N/A   Social History Main Topics  . Smoking status: Former Smoker -- 0.50 packs/day for 1 years    Types: Cigarettes    Quit date: 10/12/2009  . Smokeless tobacco: Never Used  . Alcohol Use: No  . Drug Use: No  . Sexual Activity: Yes    Birth Control/ Protection: IUD, Pill   Other Topics Concern  . None   Social History Narrative    Family History  Problem Relation Age of Onset  . Anesthesia problems Neg Hx   . Hypotension Neg Hx   . Malignant hyperthermia Neg Hx   . Pseudochol deficiency Neg Hx   . Hypertension Mother   . Diabetes Father   . Hypertension Father      Current outpatient prescriptions:  .  azithromycin (ZITHROMAX) 250 MG tablet, Take 2 now and then 1 daily x 4 days (Patient not taking: Reported on 01/17/2015), Disp: 6 tablet, Rfl: 0 .  ibuprofen (ADVIL,MOTRIN) 800 MG tablet, Take 1 tablet (800 mg total) by mouth every 8 (eight) hours  as needed for pain. (Patient not taking: Reported on 01/17/2015), Disp: 60 tablet, Rfl: 1 .  naproxen sodium (ANAPROX DS) 550 MG tablet, Take 1 tablet (550 mg total) by mouth 2 (two) times daily with a meal. (Patient not taking: Reported on 01/17/2015), Disp: 60 tablet, Rfl: 1 .  NECON 1/50, 28, 1-50 MG-MCG tablet, TAKE ONE TABLET BY MOUTH ONCE DAILY. (Patient not taking: Reported on 01/17/2015), Disp: 28 tablet, Rfl: 3 .  Norethindrone-Ethinyl Estradiol Biphasic (NECON 10/11, 28,) 0.5-35/1-35 MG-MCG tablet, Take 1 tablet by mouth daily. (Patient not taking: Reported on 01/17/2015), Disp: 1 Package, Rfl: 11  Review of Systems  Review of Systems  Constitutional: Negative for fever, chills, weight loss, malaise/fatigue and diaphoresis.  HENT: Negative for hearing loss, ear pain, nosebleeds, congestion, sore throat, neck pain, tinnitus and ear discharge.   Eyes: Negative for blurred vision, double vision, photophobia, pain, discharge and redness.  Respiratory: Negative for cough, hemoptysis, sputum production, shortness of breath, wheezing and stridor.   Cardiovascular: Negative for chest pain, palpitations, orthopnea, claudication, leg swelling and PND.  Gastrointestinal: negative for abdominal pain. Negative for heartburn, nausea, vomiting, diarrhea, constipation, blood in stool and melena.  Genitourinary: Negative for dysuria, urgency, frequency, hematuria and flank pain.  Musculoskeletal: Negative for myalgias, back pain, joint pain and  falls.  Skin: Negative for itching and rash.  Neurological: Negative for dizziness, tingling, tremors, sensory change, speech change, focal weakness, seizures, loss of consciousness, weakness and headaches.  Endo/Heme/Allergies: Negative for environmental allergies and polydipsia. Does not bruise/bleed easily.  Psychiatric/Behavioral: Negative for depression, suicidal ideas, hallucinations, memory loss and substance abuse. The patient is not nervous/anxious and does  not have insomnia.        Objective:  Blood pressure 122/70, height 5\' 2"  (1.575 m), weight 111 lb (50.349 kg), last menstrual period 12/25/2014.   Physical Exam  Vitals reviewed. Constitutional: She is oriented to person, place, and time. She appears well-developed and well-nourished.  HENT:  Head: Normocephalic and atraumatic.        Right Ear: External ear normal.  Left Ear: External ear normal.  Nose: Nose normal.  Mouth/Throat: Oropharynx is clear and moist.  Eyes: Conjunctivae and EOM are normal. Pupils are equal, round, and reactive to light. Right eye exhibits no discharge. Left eye exhibits no discharge. No scleral icterus.  Neck: Normal range of motion. Neck supple. No tracheal deviation present. No thyromegaly present.  Cardiovascular: Normal rate, regular rhythm, normal heart sounds and intact distal pulses.  Exam reveals no gallop and no friction rub.   No murmur heard. Respiratory: Effort normal and breath sounds normal. No respiratory distress. She has no wheezes. She has no rales. She exhibits no tenderness.  GI: Soft. Bowel sounds are normal. She exhibits no distension and no mass. There is no tenderness. There is no rebound and no guarding.  Genitourinary:  Breasts no masses skin changes or nipple changes bilaterally      Vulva is normal without lesions Vagina is pink moist without discharge Cervix normal in appearance and pap is done Uterus is normal size shape and contour Adnexa is negative with normal sized ovaries   Musculoskeletal: Normal range of motion. She exhibits no edema and no tenderness.  Neurological: She is alert and oriented to person, place, and time. She has normal reflexes. She displays normal reflexes. No cranial nerve deficit. She exhibits normal muscle tone. Coordination normal.  Skin: Skin is warm and dry. No rash noted. No erythema. No pallor.  Psychiatric: She has a normal mood and affect. Her behavior is normal. Judgment and thought content  normal.       Assessment:    Healthy female exam.    Plan:    Follow up in: 1 year.

## 2015-01-19 LAB — CYTOLOGY - PAP

## 2015-05-23 ENCOUNTER — Telehealth: Payer: Self-pay | Admitting: Adult Health

## 2015-05-23 NOTE — Telephone Encounter (Signed)
Pt states her and her husband having been trying to get pregnant, she is 3 days late starting her period, negative pregnancy test. Informed pt she could have QHCG done. Pt states she would give it another week and if she has not had a period she would call and have the blood work done.

## 2015-05-28 ENCOUNTER — Telehealth: Payer: Self-pay | Admitting: Adult Health

## 2015-05-28 NOTE — Telephone Encounter (Signed)
Bleeding on and off x 2 days, has nausea take HPT in am and let me know results

## 2015-05-29 ENCOUNTER — Telehealth: Payer: Self-pay | Admitting: Adult Health

## 2015-05-29 ENCOUNTER — Other Ambulatory Visit: Payer: Self-pay | Admitting: Adult Health

## 2015-05-29 DIAGNOSIS — N926 Irregular menstruation, unspecified: Secondary | ICD-10-CM

## 2015-05-29 LAB — BETA HCG QUANT (REF LAB): hCG Quant: <1 m[IU]/mL

## 2015-05-29 NOTE — Telephone Encounter (Signed)
Has nausea and spotting, HPT negative, will check [facility]

## 2015-05-29 NOTE — Telephone Encounter (Signed)
Left message HCG negative

## 2015-09-03 ENCOUNTER — Other Ambulatory Visit (HOSPITAL_COMMUNITY): Payer: Self-pay | Admitting: Physician Assistant

## 2015-09-03 ENCOUNTER — Ambulatory Visit (HOSPITAL_COMMUNITY)
Admission: RE | Admit: 2015-09-03 | Discharge: 2015-09-03 | Disposition: A | Discharge status: Home / Self Care | Payer: BLUE CROSS/BLUE SHIELD | Source: Ambulatory Visit | Attending: Physician Assistant | Admitting: Physician Assistant

## 2015-09-03 DIAGNOSIS — M25571 Pain in right ankle and joints of right foot: Secondary | ICD-10-CM | POA: Diagnosis not present

## 2015-09-03 DIAGNOSIS — S93401A Sprain of unspecified ligament of right ankle, initial encounter: Secondary | ICD-10-CM

## 2015-11-08 ENCOUNTER — Ambulatory Visit (INDEPENDENT_AMBULATORY_CARE_PROVIDER_SITE_OTHER): Payer: BLUE CROSS/BLUE SHIELD | Admitting: Adult Health

## 2015-11-08 ENCOUNTER — Encounter: Payer: Self-pay | Admitting: Adult Health

## 2015-11-08 VITALS — BP 122/70 | HR 94 | Ht 62.0 in | Wt 168.0 lb

## 2015-11-08 DIAGNOSIS — IMO0001 Reserved for inherently not codable concepts without codable children: Secondary | ICD-10-CM

## 2015-11-08 DIAGNOSIS — Z3490 Encounter for supervision of normal pregnancy, unspecified, unspecified trimester: Secondary | ICD-10-CM

## 2015-11-08 DIAGNOSIS — K5900 Constipation, unspecified: Secondary | ICD-10-CM | POA: Insufficient documentation

## 2015-11-08 DIAGNOSIS — K219 Gastro-esophageal reflux disease without esophagitis: Secondary | ICD-10-CM | POA: Diagnosis not present

## 2015-11-08 DIAGNOSIS — Z3201 Encounter for pregnancy test, result positive: Secondary | ICD-10-CM | POA: Diagnosis not present

## 2015-11-08 HISTORY — DX: Reserved for inherently not codable concepts without codable children: IMO0001

## 2015-11-08 HISTORY — DX: Constipation, unspecified: K59.00

## 2015-11-08 HISTORY — DX: Encounter for supervision of normal pregnancy, unspecified, unspecified trimester: Z34.90

## 2015-11-08 LAB — POCT URINE PREGNANCY: Preg Test, Ur: POSITIVE — AB

## 2015-11-08 MED ORDER — CITRANATAL ASSURE 35-1 & 300 MG PO MISC: 1.0000 | Freq: Every day | ORAL | Status: DC

## 2015-11-08 NOTE — Progress Notes (Addendum)
Subjective:     Patient ID: Sydney Trujillo, female   DOB: Jan 28, 1991, 24 y.o.   MRN: [id number]  HPI Passion is a 24 year old white female, married in for UPT, has had 2 +HPT, has some constipation and reflux, no bleeding or nausea/vomiting, it does hurt to lay on stomach.  Review of Systems Patient denies any headaches, hearing loss, fatigue, blurred vision, shortness of breath, chest pain, abdominal pain, problems with  urination, or intercourse. No joint pain or mood swings. See HPI for positives.  Reviewed past medical,surgical, social and family history. Reviewed medications and allergies.     Objective:   Physical Exam BP 142/72 mmHg  Pulse 94  Ht 5\' 2"  (1.575 m)  Wt 168 lb (76.204 kg)  BMI 30.72 kg/m2  LMP 10/01/2015 UPT +, about 5+3 weeks with EDD 07/07/16 by LMP,medicaid form given, Skin warm and dry. Neck: mid line trachea, normal thyroid, good ROM, no lymphadenopathy noted. Lungs: clear to ausculation bilaterally. Cardiovascular: regular rate and rhythm.   Lay on side, use pillows if needed, increase fiber and water. BP recheck 122/70. Assessment:     Pregnant Constipation  Reflux     Plan:     Rx citranatal assure #30 take 1 daily with 12 refills,can take flintstones til medicaid active Return in 2 weeks for dating [place] Review handout on first trimester Eat prunes and increase water OK to take zantac

## 2015-11-08 NOTE — Patient Instructions (Addendum)
First Trimester of Pregnancy The first trimester of pregnancy is from week 1 until the end of week 12 (months 1 through 3). A week after a sperm fertilizes an egg, the egg will implant on the wall of the uterus. This embryo will begin to develop into a baby. Genes from you and your partner are forming the baby. The female genes determine whether the baby is a boy or a girl. At 6-8 weeks, the eyes and face are formed, and the heartbeat can be seen on ultrasound. At the end of 12 weeks, all the baby's organs are formed.  Now that you are pregnant, you will want to do everything you can to have a healthy baby. Two of the most important things are to get good prenatal care and to follow your health care provider's instructions. Prenatal care is all the medical care you receive before the baby's birth. This care will help prevent, find, and treat any problems during the pregnancy and childbirth. BODY CHANGES Your body goes through many changes during pregnancy. The changes vary from woman to woman.   You may gain or lose a couple of pounds at first.  You may feel sick to your stomach (nauseous) and throw up (vomit). If the vomiting is uncontrollable, call your health care provider.  You may tire easily.  You may develop headaches that can be relieved by medicines approved by your health care provider.  You may urinate more often. Painful urination may mean you have a bladder infection.  You may develop heartburn as a result of your pregnancy.  You may develop constipation because certain hormones are causing the muscles that push waste through your intestines to slow down.  You may develop hemorrhoids or swollen, bulging veins (varicose veins).  Your breasts may begin to grow larger and become tender. Your nipples may stick out more, and the tissue that surrounds them (areola) may become darker.  Your gums may bleed and may be sensitive to brushing and flossing.  Dark spots or blotches (chloasma,  mask of pregnancy) may develop on your face. This will likely fade after the baby is born.  Your menstrual periods will stop.  You may have a loss of appetite.  You may develop cravings for certain kinds of food.  You may have changes in your emotions from day to day, such as being excited to be pregnant or being concerned that something may go wrong with the pregnancy and baby.  You may have more vivid and strange dreams.  You may have changes in your hair. These can include thickening of your hair, rapid growth, and changes in texture. Some women also have hair loss during or after pregnancy, or hair that feels dry or thin. Your hair will most likely return to normal after your baby is born. WHAT TO EXPECT AT YOUR PRENATAL VISITS During a routine prenatal visit:  You will be weighed to make sure you and the baby are growing normally.  Your blood pressure will be taken.  Your abdomen will be measured to track your baby's growth.  The fetal heartbeat will be listened to starting around week 10 or 12 of your pregnancy.  Test results from any previous visits will be discussed. Your health care provider may ask you:  How you are feeling.  If you are feeling the baby move.  If you have had any abnormal symptoms, such as leaking fluid, bleeding, severe headaches, or abdominal cramping.  If you are using any tobacco products,   including cigarettes, chewing tobacco, and electronic cigarettes.  If you have any questions. Other tests that may be performed during your first trimester include:  Blood tests to find your blood type and to check for the presence of any previous infections. They will also be used to check for low iron levels (anemia) and Rh antibodies. Later in the pregnancy, blood tests for diabetes will be done along with other tests if problems develop.  Urine tests to check for infections, diabetes, or protein in the urine.  An ultrasound to confirm the proper growth  and development of the baby.  An amniocentesis to check for possible genetic problems.  Fetal screens for spina bifida and Down syndrome.  You may need other tests to make sure you and the baby are doing well.  HIV (human immunodeficiency virus) testing. Routine prenatal testing includes screening for HIV, unless you choose not to have this test. HOME CARE INSTRUCTIONS  Medicines  Follow your health care provider's instructions regarding medicine use. Specific medicines may be either safe or unsafe to take during pregnancy.  Take your prenatal vitamins as directed.  If you develop constipation, try taking a stool softener if your health care provider approves. Diet  Eat regular, well-balanced meals. Choose a variety of foods, such as meat or vegetable-based protein, fish, milk and low-fat dairy products, vegetables, fruits, and whole grain breads and cereals. Your health care provider will help you determine the amount of weight gain that is right for you.  Avoid raw meat and uncooked cheese. These carry germs that can cause birth defects in the baby.  Eating four or five small meals rather than three large meals a day may help relieve nausea and vomiting. If you start to feel nauseous, eating a few soda crackers can be helpful. Drinking liquids between meals instead of during meals also seems to help nausea and vomiting.  If you develop constipation, eat more high-fiber foods, such as fresh vegetables or fruit and whole grains. Drink enough fluids to keep your urine clear or pale yellow. Activity and Exercise  Exercise only as directed by your health care provider. Exercising will help you:  Control your weight.  Stay in shape.  Be prepared for labor and delivery.  Experiencing pain or cramping in the lower abdomen or low back is a good sign that you should stop exercising. Check with your health care provider before continuing normal exercises.  Try to avoid standing for long  periods of time. Move your legs often if you must stand in one place for a long time.  Avoid heavy lifting.  Wear low-heeled shoes, and practice good posture.  You may continue to have sex unless your health care provider directs you otherwise. Relief of Pain or Discomfort  Wear a good support bra for breast tenderness.   Take warm sitz baths to soothe any pain or discomfort caused by hemorrhoids. Use hemorrhoid cream if your health care provider approves.   Rest with your legs elevated if you have leg cramps or low back pain.  If you develop varicose veins in your legs, wear support hose. Elevate your feet for 15 minutes, 3-4 times a day. Limit salt in your diet. Prenatal Care  Schedule your prenatal visits by the twelfth week of pregnancy. They are usually scheduled monthly at first, then more often in the last 2 months before delivery.  Write down your questions. Take them to your prenatal visits.  Keep all your prenatal visits as directed by your   health care provider. Safety  Wear your seat belt at all times when driving.  Make a list of emergency phone numbers, including numbers for family, friends, the hospital, and police and fire departments. General Tips  Ask your health care provider for a referral to a local prenatal education class. Begin classes no later than at the beginning of month 6 of your pregnancy.  Ask for help if you have counseling or nutritional needs during pregnancy. Your health care provider can offer advice or refer you to specialists for help with various needs.  Do not use hot tubs, steam rooms, or saunas.  Do not douche or use tampons or scented sanitary pads.  Do not cross your legs for long periods of time.  Avoid cat litter boxes and soil used by cats. These carry germs that can cause birth defects in the baby and possibly loss of the fetus by miscarriage or stillbirth.  Avoid all smoking, herbs, alcohol, and medicines not prescribed by  your health care provider. Chemicals in these affect the formation and growth of the baby.  Do not use any tobacco products, including cigarettes, chewing tobacco, and electronic cigarettes. If you need help quitting, ask your health care provider. You may receive counseling support and other resources to help you quit.  Schedule a dentist appointment. At home, brush your teeth with a soft toothbrush and be gentle when you floss. SEEK MEDICAL CARE IF:   You have dizziness.  You have mild pelvic cramps, pelvic pressure, or nagging pain in the abdominal area.  You have persistent nausea, vomiting, or diarrhea.  You have a bad smelling vaginal discharge.  You have pain with urination.  You notice increased swelling in your face, hands, legs, or ankles. SEEK IMMEDIATE MEDICAL CARE IF:   You have a fever.  You are leaking fluid from your vagina.  You have spotting or bleeding from your vagina.  You have severe abdominal cramping or pain.  You have rapid weight gain or loss.  You vomit blood or material that looks like coffee grounds.  You are exposed to [place] measles and have never had them.  You are exposed to fifth disease or chickenpox.  You develop a severe headache.  You have shortness of breath.  You have any kind of trauma, such as from a fall or a car accident.   This information is not intended to replace advice given to you by your health care provider. Make sure you discuss any questions you have with your health care provider.   Document Released: 11/11/2001 Document Revised: 12/08/2014 Document Reviewed: 09/27/2013 Elsevier Interactive Patient Education [employer]. Return in 2 weeks for dating [place]  Eat prunes and increase water\Ok to take zantac

## 2015-11-14 ENCOUNTER — Other Ambulatory Visit: Payer: Self-pay | Admitting: Obstetrics and Gynecology

## 2015-11-14 DIAGNOSIS — O3680X0 Pregnancy with inconclusive fetal viability, not applicable or unspecified: Secondary | ICD-10-CM

## 2015-11-22 ENCOUNTER — Other Ambulatory Visit: Payer: BLUE CROSS/BLUE SHIELD

## 2015-11-23 ENCOUNTER — Telehealth: Payer: Self-pay | Admitting: *Deleted

## 2015-11-23 NOTE — Telephone Encounter (Signed)
Pt c/o brownish color on toilet paper when she wipes, no cramping, no vaginal bleeding. Pt about [redacted] weeks pregnant. Pt informed can be normal to have a brownish discharge early pregnancy if Pilar Plate bleeding or cramping occur go to [facility] or call our office back. Pt has initial u/s scheduled 12/04/2014. Pt verbalized understanding.

## 2015-12-02 NOTE — L&D Delivery Note (Signed)
Delivery Note At 1:24 AM a viable female was delivered via Vaginal, Spontaneous Delivery (Presentation:ROA) after IOL for gHTN.  APGAR: 8, 9. Placenta status: delivered intact spontaneously with fundal pressure and cord traction  Cord:  3 vessel without complication  Anesthesia:  Epidural Episiotomy: None Lacerations: 1st degree;Labial Suture Repair: No repair needed Est. Blood Loss (mL): 150  Mom to postpartum.  Baby to Couplet care / Skin to Skin. All instruments and gauze accounted for and sharps disposed of.  Casimer Leek, MD, PHY-1, MPH 07/08/2016, 1:45 AM  Midwife attestation: I was gloved and present for delivery in its entirety and I agree with the above resident's note.  LEFTWICH-KIRBY, Cono Gebhard, CNM 4:07 AM

## 2015-12-05 ENCOUNTER — Ambulatory Visit (INDEPENDENT_AMBULATORY_CARE_PROVIDER_SITE_OTHER): Payer: BLUE CROSS/BLUE SHIELD

## 2015-12-05 DIAGNOSIS — O3680X0 Pregnancy with inconclusive fetal viability, not applicable or unspecified: Secondary | ICD-10-CM

## 2015-12-05 NOTE — Progress Notes (Signed)
[place] 8+1wks,single IUP w/ys,pos fht 176 bpm,unable to see rt ov,rt adnexa wnl,normal lt ov,crl 18.87mm,subchorionic hemorrhage 1.6 x .8 x 1.3 cm,pt is not having any bleeding at this time.

## 2015-12-19 ENCOUNTER — Encounter: Payer: BLUE CROSS/BLUE SHIELD | Admitting: Women's Health

## 2015-12-20 ENCOUNTER — Encounter: Payer: Self-pay | Admitting: Advanced Practice Midwife

## 2015-12-20 ENCOUNTER — Ambulatory Visit (INDEPENDENT_AMBULATORY_CARE_PROVIDER_SITE_OTHER): Payer: BLUE CROSS/BLUE SHIELD | Admitting: Advanced Practice Midwife

## 2015-12-20 VITALS — BP 130/70 | HR 96 | Wt 169.0 lb

## 2015-12-20 DIAGNOSIS — Z3401 Encounter for supervision of normal first pregnancy, first trimester: Secondary | ICD-10-CM

## 2015-12-20 DIAGNOSIS — Z1389 Encounter for screening for other disorder: Secondary | ICD-10-CM

## 2015-12-20 DIAGNOSIS — Z331 Pregnant state, incidental: Secondary | ICD-10-CM

## 2015-12-20 DIAGNOSIS — Z369 Encounter for antenatal screening, unspecified: Secondary | ICD-10-CM

## 2015-12-20 LAB — POCT URINALYSIS DIPSTICK
Glucose, UA: NEGATIVE
KETONES UA: NEGATIVE
Nitrite, UA: NEGATIVE
RBC UA: NEGATIVE

## 2015-12-20 MED ORDER — PNV PRENATAL PLUS MULTIVITAMIN 27-1 MG PO TABS: 1.0000 | ORAL_TABLET | Freq: Every day | ORAL | Status: DC

## 2015-12-20 NOTE — Progress Notes (Signed)
  Subjective:    Sydney Trujillo is a G1P0 [redacted]w[redacted]d being seen today for her first obstetrical visit.  Her obstetrical history is significant for first pregnancy.  Pregnancy history fully reviewed.  Patient reports no complaints.  Filed Vitals:   12/20/15 0844  BP: 130/70  Pulse: 96  Weight: 169 lb (76.658 kg)    HISTORY: OB History  Gravida Para Term Preterm AB SAB TAB Ectopic Multiple Living  1             # Outcome Date GA Lbr Len/2nd Weight Sex Delivery Anes PTL Lv  1 Current              Past Medical History  Diagnosis Date  . Irregular bleeding 08/30/2013  . Abdominal pain 09/07/2013  . IUD (intrauterine device) in place 10/18/2013    07/2009 IUD  . Pregnant 11/08/2015  . Constipation 11/08/2015  . Reflux 11/08/2015   Past Surgical History  Procedure Laterality Date  . Breast biopsy  10/17/2011    Procedure: BREAST BIOPSY;  Surgeon: Donato Heinz;  Location: [facility];  Service: General;  Laterality: Left;   Family History  Problem Relation Age of Onset  . Anesthesia problems Neg Hx   . Hypotension Neg Hx   . Malignant hyperthermia Neg Hx   . Pseudochol deficiency Neg Hx   . Hypertension Mother   . Diabetes Father   . Hypertension Father   . Diabetes Paternal Grandmother   . Hypertension Paternal Grandmother   . Fibromyalgia Paternal Grandmother      Exam                                      System:     Skin: normal coloration and turgor, no rashes    Neurologic: oriented, normal, normal mood   Extremities: normal strength, tone, and muscle mass   HEENT PERRLA   Mouth/Teeth mucous membranes moist, normal dentition   Neck supple and no masses   Cardiovascular: regular rate and rhythm   Respiratory:  appears well, vitals normal, no respiratory distress, acyanotic   Abdomen: soft, non-tender;  FHR: 150 [place]          Assessment:    Pregnancy: G1P0 Patient Active Problem List   Diagnosis Date Noted  . Pregnant 11/08/2015  . Constipation  11/08/2015  . Reflux 11/08/2015  . IUD (intrauterine device) in place 10/18/2013  . Abdominal pain 09/07/2013  . Irregular bleeding 08/30/2013        Plan:     Initial labs drawn. Continue prenatal vitamins  CCNC not done Problem list reviewed and updated  Reviewed n/v relief measures and warning s/s to report  Reviewed recommended weight gain based on pre-gravid BMI  Encouraged well-balanced diet Genetic Screening discussed Integrated Screen: requested.  Ultrasound discussed; fetal survey: requested.  Return in about 2 weeks (around 01/03/2016) for US:NT+1st IT, LROB.  CRESENZO-DISHMAN,Zhoe Catania 12/20/2015

## 2015-12-21 LAB — PMP SCREEN PROFILE (10S), URINE

## 2015-12-24 LAB — MICROSCOPIC EXAMINATION
Casts: NONE SEEN /lpf
Epithelial Cells (non renal): >10 /hpf — AB (ref 0–10)
WBC, UA: >30 /hpf — AB (ref 0–?)

## 2015-12-24 LAB — RPR: RPR: NONREACTIVE

## 2015-12-24 LAB — URINALYSIS, ROUTINE W REFLEX MICROSCOPIC
BILIRUBIN UA: NEGATIVE
Glucose, UA: NEGATIVE
KETONES UA: NEGATIVE
Nitrite, UA: NEGATIVE
SPEC GRAV UA: 1.023 (ref 1.005–1.030)
Urobilinogen, Ur: 0.2 mg/dL (ref 0.2–1.0)
pH, UA: 7 (ref 5.0–7.5)

## 2015-12-24 LAB — CBC
HEMATOCRIT: 37.2 % (ref 34.0–46.6)
HEMOGLOBIN: 12.5 g/dL (ref 11.1–15.9)
MCH: 28.5 pg (ref 26.6–33.0)
MCHC: 33.6 g/dL (ref 31.5–35.7)
MCV: 85 fL (ref 79–97)
Platelets: 296 10*3/uL (ref 150–379)
RBC: 4.39 x10E6/uL (ref 3.77–5.28)
RDW: 14.1 % (ref 12.3–15.4)
WBC: 7 10*3/uL (ref 3.4–10.8)

## 2015-12-24 LAB — ABO/RH: Rh Factor: POSITIVE

## 2015-12-24 LAB — HEPATITIS B SURFACE ANTIGEN: Hepatitis B Surface Ag: NEGATIVE

## 2015-12-24 LAB — VARICELLA ZOSTER ANTIBODY, IGG: Varicella zoster IgG: 714 index (ref >165)

## 2015-12-24 LAB — ANTIBODY SCREEN: ANTIBODY SCREEN: NEGATIVE

## 2015-12-24 LAB — HIV ANTIBODY (ROUTINE TESTING W REFLEX): HIV Screen 4th Generation wRfx: NONREACTIVE

## 2015-12-24 LAB — RUBELLA SCREEN: Rubella Antibodies, IGG: 1.92 index (ref >0.99)

## 2015-12-31 ENCOUNTER — Other Ambulatory Visit: Payer: Self-pay | Admitting: Advanced Practice Midwife

## 2015-12-31 DIAGNOSIS — Z3682 Encounter for antenatal screening for nuchal translucency: Secondary | ICD-10-CM

## 2016-01-03 ENCOUNTER — Ambulatory Visit (INDEPENDENT_AMBULATORY_CARE_PROVIDER_SITE_OTHER): Payer: BLUE CROSS/BLUE SHIELD

## 2016-01-03 ENCOUNTER — Ambulatory Visit (INDEPENDENT_AMBULATORY_CARE_PROVIDER_SITE_OTHER): Payer: BLUE CROSS/BLUE SHIELD | Admitting: Advanced Practice Midwife

## 2016-01-03 ENCOUNTER — Encounter: Payer: Self-pay | Admitting: Advanced Practice Midwife

## 2016-01-03 VITALS — BP 120/72 | HR 84 | Wt 167.0 lb

## 2016-01-03 DIAGNOSIS — Z3A12 12 weeks gestation of pregnancy: Secondary | ICD-10-CM | POA: Diagnosis not present

## 2016-01-03 DIAGNOSIS — Z36 Encounter for antenatal screening of mother: Secondary | ICD-10-CM

## 2016-01-03 DIAGNOSIS — Z3682 Encounter for antenatal screening for nuchal translucency: Secondary | ICD-10-CM

## 2016-01-03 DIAGNOSIS — Z3401 Encounter for supervision of normal first pregnancy, first trimester: Secondary | ICD-10-CM

## 2016-01-03 DIAGNOSIS — Z369 Encounter for antenatal screening, unspecified: Secondary | ICD-10-CM

## 2016-01-03 DIAGNOSIS — Z3491 Encounter for supervision of normal pregnancy, unspecified, first trimester: Secondary | ICD-10-CM

## 2016-01-03 DIAGNOSIS — Z1389 Encounter for screening for other disorder: Secondary | ICD-10-CM

## 2016-01-03 DIAGNOSIS — Z331 Pregnant state, incidental: Secondary | ICD-10-CM

## 2016-01-03 DIAGNOSIS — Z3A13 13 weeks gestation of pregnancy: Secondary | ICD-10-CM

## 2016-01-03 LAB — POCT URINALYSIS DIPSTICK
Blood, UA: NEGATIVE
Glucose, UA: NEGATIVE
KETONES UA: NEGATIVE
LEUKOCYTES UA: NEGATIVE
Nitrite, UA: NEGATIVE

## 2016-01-03 NOTE — Progress Notes (Signed)
G1P0 [redacted]w[redacted]d Estimated Date of Delivery: 07/15/16  Blood pressure 120/72, pulse 84, weight 167 lb (75.751 kg), last menstrual period 10/01/2015.   BP weight and urine results all reviewed and noted.  Please refer to the obstetrical flow sheet for the fundal height and fetal heart rate documentation:US 12+2wks,measurements c/w dates,crl 64.35mm,NB present,NT 1.7mm,normal ov's bilat,ant pl gr 0 Pt, denies any bleeding and no rupture of membranes symptoms or regular contractions. Patient is without complaints. All questions were answered.  Orders Placed This Encounter  Procedures  . Maternal Screen, Integrated #1  . POCT urinalysis dipstick    Plan:  Continued routine obstetrical care, NT/IT today  Return in about 4 weeks (around 01/31/2016) for [place], 2nd IT.

## 2016-01-03 NOTE — Progress Notes (Signed)
Pt denies any problems or concerns at this time.  

## 2016-01-03 NOTE — Progress Notes (Signed)
[place] 12+2wks,measurements c/w dates,crl 64.66mm,NB present,NT 1.79mm,normal ov's bilat,ant pl gr 0

## 2016-01-04 LAB — URINE CULTURE

## 2016-01-05 LAB — MATERNAL SCREEN, INTEGRATED #1
CROWN RUMP LENGTH MAT SCREEN: 64.8 mm
GEST. AGE ON COLLECTION DATE: 12.7 wk
Maternal Age at EDD: 25.4 years
Nuchal Translucency (NT): 1.4 mm
Number of Fetuses: 1
PAPP-A Value: 819.4 ng/mL
WEIGHT: 167 [lb_av]

## 2016-01-05 LAB — GC/CHLAMYDIA PROBE AMP
CHLAMYDIA, DNA PROBE: NEGATIVE
NEISSERIA GONORRHOEAE BY PCR: NEGATIVE

## 2016-01-24 ENCOUNTER — Ambulatory Visit (INDEPENDENT_AMBULATORY_CARE_PROVIDER_SITE_OTHER): Payer: BLUE CROSS/BLUE SHIELD | Admitting: Advanced Practice Midwife

## 2016-01-24 VITALS — BP 124/68 | HR 78 | Wt 170.0 lb

## 2016-01-24 DIAGNOSIS — Z331 Pregnant state, incidental: Secondary | ICD-10-CM

## 2016-01-24 DIAGNOSIS — Z3A16 16 weeks gestation of pregnancy: Secondary | ICD-10-CM

## 2016-01-24 DIAGNOSIS — Z1389 Encounter for screening for other disorder: Secondary | ICD-10-CM

## 2016-01-24 DIAGNOSIS — Z3402 Encounter for supervision of normal first pregnancy, second trimester: Secondary | ICD-10-CM

## 2016-01-24 LAB — POCT URINALYSIS DIPSTICK
Blood, UA: NEGATIVE
GLUCOSE UA: NEGATIVE
KETONES UA: NEGATIVE
Leukocytes, UA: NEGATIVE
Nitrite, UA: NEGATIVE
Protein, UA: NEGATIVE

## 2016-01-24 NOTE — Progress Notes (Signed)
G1P0 [redacted]w[redacted]d Estimated Date of Delivery: 07/15/16  Last menstrual period 10/01/2015.    WORK IN FOR BLEEDING.  Saw pink 3 times today after wiping.  None last time she wiped.  No itching/irritation/discharge SSE:  Vaginal red, moderated cottage cheese d/e c/w yeast  BP weight and urine results all reviewed and noted.  Please refer to the obstetrical flow sheet for the fundal height and fetal heart rate documentation:  .All questions were answered.  Orders Placed This Encounter  Procedures  . POCT urinalysis dipstick    Plan:  Continued routine obstetrical care, OTC 7 day yeast cream  Return for As scheduled.

## 2016-01-31 ENCOUNTER — Ambulatory Visit (INDEPENDENT_AMBULATORY_CARE_PROVIDER_SITE_OTHER): Payer: BLUE CROSS/BLUE SHIELD | Admitting: Advanced Practice Midwife

## 2016-01-31 VITALS — BP 124/64 | HR 88 | Wt 171.0 lb

## 2016-01-31 DIAGNOSIS — Z363 Encounter for antenatal screening for malformations: Secondary | ICD-10-CM

## 2016-01-31 DIAGNOSIS — Z3492 Encounter for supervision of normal pregnancy, unspecified, second trimester: Secondary | ICD-10-CM

## 2016-01-31 DIAGNOSIS — Z3682 Encounter for antenatal screening for nuchal translucency: Secondary | ICD-10-CM

## 2016-01-31 DIAGNOSIS — Z331 Pregnant state, incidental: Secondary | ICD-10-CM

## 2016-01-31 DIAGNOSIS — Z1389 Encounter for screening for other disorder: Secondary | ICD-10-CM

## 2016-01-31 LAB — POCT URINALYSIS DIPSTICK
Blood, UA: NEGATIVE
GLUCOSE UA: NEGATIVE
Ketones, UA: NEGATIVE
LEUKOCYTES UA: NEGATIVE
Nitrite, UA: NEGATIVE

## 2016-01-31 NOTE — Progress Notes (Signed)
G1P0 [redacted]w[redacted]d Estimated Date of Delivery: 07/15/16  Blood pressure 124/64, pulse 88, weight 171 lb (77.565 kg), last menstrual period 10/01/2015.   BP weight and urine results all reviewed and noted.  Please refer to the obstetrical flow sheet for the fundal height and fetal heart rate documentation:  Patient reports good fetal movement, denies any bleeding and no rupture of membranes symptoms or regular contractions. Patient is without complaints. All questions were answered.  Orders Placed This Encounter  Procedures  . US OB Comp + 14 Wk  . Maternal Screen, Integrated #2  . POCT urinalysis dipstick    Plan:  Continued routine obstetrical care, 2nd IT  Return in about 3 weeks (around 02/21/2016) for [id number], LROB.

## 2016-02-02 LAB — MATERNAL SCREEN, INTEGRATED #2
ADSF: 1.27
AFP MOM: 0.77
Alpha-Fetoprotein: 23.5 ng/mL
CROWN RUMP LENGTH: 64.8 mm
DIA MOM: 0.93
DIA Value: 147.4 pg/mL
ESTRIOL UNCONJUGATED: 1.22 ng/mL
GEST. AGE ON COLLECTION DATE: 12.7 wk
Gestational Age: 16.7 weeks
HCG MOM: 0.49
Maternal Age at EDD: 25.4 years
NUCHAL TRANSLUCENCY MOM: 0.86
NUMBER OF FETUSES: 1
Nuchal Translucency (NT): 1.4 mm
PAPP-A MoM: 0.88
PAPP-A Value: 819.4 ng/mL
TEST RESULTS: NEGATIVE
Weight: 167 [lb_av]
Weight: 171 [lb_av]
hCG Value: 13.7 IU/mL

## 2016-02-07 ENCOUNTER — Encounter: Payer: Self-pay | Admitting: Advanced Practice Midwife

## 2016-02-07 DIAGNOSIS — R87610 Atypical squamous cells of undetermined significance on cytologic smear of cervix (ASC-US): Secondary | ICD-10-CM | POA: Insufficient documentation

## 2016-02-21 ENCOUNTER — Encounter: Payer: Self-pay | Admitting: Obstetrics & Gynecology

## 2016-02-21 ENCOUNTER — Ambulatory Visit (INDEPENDENT_AMBULATORY_CARE_PROVIDER_SITE_OTHER): Payer: BLUE CROSS/BLUE SHIELD | Admitting: Obstetrics & Gynecology

## 2016-02-21 ENCOUNTER — Ambulatory Visit (INDEPENDENT_AMBULATORY_CARE_PROVIDER_SITE_OTHER): Payer: BLUE CROSS/BLUE SHIELD

## 2016-02-21 VITALS — BP 120/70 | HR 76 | Wt 171.4 lb

## 2016-02-21 DIAGNOSIS — Z331 Pregnant state, incidental: Secondary | ICD-10-CM

## 2016-02-21 DIAGNOSIS — Z3492 Encounter for supervision of normal pregnancy, unspecified, second trimester: Secondary | ICD-10-CM

## 2016-02-21 DIAGNOSIS — Z363 Encounter for antenatal screening for malformations: Secondary | ICD-10-CM

## 2016-02-21 DIAGNOSIS — Z1389 Encounter for screening for other disorder: Secondary | ICD-10-CM

## 2016-02-21 DIAGNOSIS — Z36 Encounter for antenatal screening of mother: Secondary | ICD-10-CM | POA: Diagnosis not present

## 2016-02-21 LAB — POCT URINALYSIS DIPSTICK
Blood, UA: NEGATIVE
GLUCOSE UA: NEGATIVE
Ketones, UA: NEGATIVE
Leukocytes, UA: NEGATIVE
NITRITE UA: NEGATIVE
PROTEIN UA: NEGATIVE

## 2016-02-21 NOTE — Progress Notes (Signed)
G1P0 [redacted]w[redacted]d Estimated Date of Delivery: 07/15/16  Blood pressure 120/70, pulse 76, weight 171 lb 6.4 oz (77.747 kg), last menstrual period 10/01/2015.   BP weight and urine results all reviewed and noted.  Please refer to the obstetrical flow sheet for the fundal height and fetal heart rate documentation:  Patient reports good fetal movement, denies any bleeding and no rupture of membranes symptoms or regular contractions. Patient is without complaints. All questions were answered.  Orders Placed This Encounter  Procedures  . POCT urinalysis dipstick    Plan:  Continued routine obstetrical care, sonogram is normal  No Follow-up on file.

## 2016-02-21 NOTE — Progress Notes (Signed)
[place] 19+2 wks,breech,cx 3.5 cm,ant pl gr 0,svp of fluid 4.2 cm,normal ov's bilat,fhr 151 bpm,efw 295 g,measurements c/w dates,anatomy complete,no obvious abn seen

## 2016-03-21 ENCOUNTER — Encounter: Payer: Self-pay | Admitting: Obstetrics and Gynecology

## 2016-03-21 ENCOUNTER — Ambulatory Visit (INDEPENDENT_AMBULATORY_CARE_PROVIDER_SITE_OTHER): Payer: 59 | Admitting: Obstetrics and Gynecology

## 2016-03-21 ENCOUNTER — Other Ambulatory Visit (HOSPITAL_COMMUNITY)
Admission: RE | Admit: 2016-03-21 | Discharge: 2016-03-21 | Disposition: A | Discharge status: Home / Self Care | Payer: 59 | Source: Ambulatory Visit | Attending: Obstetrics and Gynecology | Admitting: Obstetrics and Gynecology

## 2016-03-21 ENCOUNTER — Encounter: Payer: BLUE CROSS/BLUE SHIELD | Admitting: Obstetrics and Gynecology

## 2016-03-21 VITALS — BP 136/62 | HR 80 | Wt 177.4 lb

## 2016-03-21 DIAGNOSIS — Z01411 Encounter for gynecological examination (general) (routine) with abnormal findings: Secondary | ICD-10-CM | POA: Insufficient documentation

## 2016-03-21 DIAGNOSIS — Z3492 Encounter for supervision of normal pregnancy, unspecified, second trimester: Secondary | ICD-10-CM

## 2016-03-21 DIAGNOSIS — Z124 Encounter for screening for malignant neoplasm of cervix: Secondary | ICD-10-CM

## 2016-03-21 DIAGNOSIS — Z1389 Encounter for screening for other disorder: Secondary | ICD-10-CM

## 2016-03-21 DIAGNOSIS — Z331 Pregnant state, incidental: Secondary | ICD-10-CM

## 2016-03-21 LAB — POCT URINALYSIS DIPSTICK
GLUCOSE UA: NEGATIVE
KETONES UA: NEGATIVE
Leukocytes, UA: NEGATIVE
Nitrite, UA: NEGATIVE
Protein, UA: NEGATIVE
RBC UA: NEGATIVE

## 2016-03-21 NOTE — Progress Notes (Signed)
  Assessment:  Annual Gyn Exam   Plan:  1. pap smear done, next pap due 1 year 2. return annually or prn 3    Annual mammogram advised Subjective:  Sydney Trujillo is a 25 y.o. female G1P0 who presents for 23 wk exam, and is noted to need her pap done. Patient's last menstrual period was 10/01/2015 (exact date). The patient has no complaints today at this time. Pt is currently [redacted] weeks pregnant and she hasn't been to the new parenting classes at [facility]. Pt reports that she is here for a pap due to having an abnormal pap last year indicative of ASC-US. Pt states that she has a red spot on her left breast that she noticed in the last month that has increased in size. Pt denies any other symptoms.   The following portions of the patient's history were reviewed and updated as appropriate: allergies, current medications, past family history, past medical history, past social history, past surgical history and problem list. Past Medical History  Diagnosis Date  . Irregular bleeding 08/30/2013  . Abdominal pain 09/07/2013  . IUD (intrauterine device) in place 10/18/2013    07/2009 IUD  . Pregnant 11/08/2015  . Constipation 11/08/2015  . Reflux 11/08/2015    Past Surgical History  Procedure Laterality Date  . Breast biopsy  10/17/2011    Procedure: BREAST BIOPSY;  Surgeon: Donato Heinz;  Location: [facility];  Service: General;  Laterality: Left;     Current outpatient prescriptions:  .  loratadine (CLARITIN) 10 MG tablet, Take 10 mg by mouth daily., Disp: , Rfl:  .  Prenatal Vit-Fe Fumarate-FA (MULTIVITAMIN-PRENATAL) 27-0.8 MG TABS tablet, Take 1 tablet by mouth daily at 12 noon., Disp: , Rfl:   Review of Systems Constitutional: negative Gastrointestinal: negative Genitourinary: negative  Objective:  BP 136/62 mmHg  Pulse 80  Wt 177 lb 6.4 oz (80.468 kg)  LMP 10/01/2015 (Exact Date)   BMI: Body mass index is 32.44 kg/(m^2).  General Appearance: Alert, appropriate appearance for age.  No acute distress HEENT: Grossly normal Neck / Thyroid:  Cardiovascular: RRR; normal S1, S2, no murmur Lungs: CTA bilaterally Back: No CVAT Breast Exam: hemangioma noted to left breast. No masses or nodes.No dimpling, nipple retraction or discharge. Gastrointestinal: Soft, non-tender, no masses or organomegaly Pelvic Exam: Vulva and vagina appear normal. Bimanual exam reveals normal uterus and adnexa. Vaginal: normal mucosa without prolapse or lesions, normal without tenderness, induration or masses and normal rugae Cervix: normal appearance Adnexa: normal bimanual exam Uterus: gravid uterus at 23 weeks Rectovaginal: not indicated Lymphatic Exam: Non-palpable nodes in neck, clavicular, axillary, or inguinal regions  Skin: no rash or abnormalities Neurologic: Normal gait and speech, no tremor  Psychiatric: Alert and oriented, appropriate affect. Urinalysis:normal   Fetal heart rate: [place]. 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 The recorded information has been reviewed and considered accurate. It has been edited as necessary during review. Jonnie Kind, MD     By signing my name below, I, Sydney Trujillo, attest that this documentation has been prepared under the direction and in the presence of Jonnie Kind, MD. Electronically Signed: Soijett Trujillo, ED Scribe. 03/21/2016. 9:18 AM.

## 2016-03-25 LAB — CYTOLOGY - PAP

## 2016-04-04 ENCOUNTER — Telehealth: Payer: Self-pay | Admitting: Adult Health

## 2016-04-04 NOTE — Telephone Encounter (Signed)
Crystal spoke with pt. Pt has Zantac and was wondering if that was safe to take. Pt is [redacted] weeks pregnant. Crystal spoke to Dr. Glo Herring and he advised to try Zantac. Pt was advised to let us know if Zantac didn't help. Pt voiced understanding. [place]

## 2016-04-04 NOTE — Telephone Encounter (Signed)
Left message x 1. JSY 

## 2016-04-17 ENCOUNTER — Ambulatory Visit (INDEPENDENT_AMBULATORY_CARE_PROVIDER_SITE_OTHER): Payer: 59 | Admitting: Advanced Practice Midwife

## 2016-04-17 ENCOUNTER — Other Ambulatory Visit: Payer: 59

## 2016-04-17 VITALS — BP 120/60 | HR 78 | Wt 184.0 lb

## 2016-04-17 DIAGNOSIS — Z3492 Encounter for supervision of normal pregnancy, unspecified, second trimester: Secondary | ICD-10-CM

## 2016-04-17 DIAGNOSIS — Z369 Encounter for antenatal screening, unspecified: Secondary | ICD-10-CM

## 2016-04-17 DIAGNOSIS — Z331 Pregnant state, incidental: Secondary | ICD-10-CM

## 2016-04-17 DIAGNOSIS — Z131 Encounter for screening for diabetes mellitus: Secondary | ICD-10-CM

## 2016-04-17 DIAGNOSIS — Z1389 Encounter for screening for other disorder: Secondary | ICD-10-CM

## 2016-04-17 DIAGNOSIS — R87610 Atypical squamous cells of undetermined significance on cytologic smear of cervix (ASC-US): Secondary | ICD-10-CM

## 2016-04-17 LAB — POCT URINALYSIS DIPSTICK
GLUCOSE UA: NEGATIVE
Ketones, UA: NEGATIVE
Leukocytes, UA: NEGATIVE
Nitrite, UA: NEGATIVE
Protein, UA: NEGATIVE
RBC UA: NEGATIVE

## 2016-04-17 NOTE — Progress Notes (Signed)
G1P0 [redacted]w[redacted]d Estimated Date of Delivery: 07/15/16  Last menstrual period 10/01/2015.   BP weight and urine results all reviewed and noted.  Please refer to the obstetrical flow sheet for the fundal height and fetal heart rate documentation:  Patient reports good fetal movement, denies any bleeding and no rupture of membranes symptoms or regular contractions. Patient is without complaints. All questions were answered.  Orders Placed This Encounter  Procedures  . POCT urinalysis dipstick    Plan:  Continued routine obstetrical care, PN2 today  Return in about 3 weeks (around 05/08/2016) for LROB.

## 2016-04-17 NOTE — Patient Instructions (Signed)

## 2016-04-18 LAB — CBC
HEMATOCRIT: 34.6 % (ref 34.0–46.6)
HEMOGLOBIN: 11.4 g/dL (ref 11.1–15.9)
MCH: 29.2 pg (ref 26.6–33.0)
MCHC: 32.9 g/dL (ref 31.5–35.7)
MCV: 89 fL (ref 79–97)
Platelets: 214 10*3/uL (ref 150–379)
RBC: 3.9 x10E6/uL (ref 3.77–5.28)
RDW: 14.2 % (ref 12.3–15.4)
WBC: 9.5 10*3/uL (ref 3.4–10.8)

## 2016-04-18 LAB — GLUCOSE TOLERANCE, 2 HOURS W/ 1HR
GLUCOSE, 1 HOUR: 163 mg/dL (ref 65–179)
GLUCOSE, FASTING: 82 mg/dL (ref 65–91)
Glucose, 2 hour: 122 mg/dL (ref 65–152)

## 2016-04-18 LAB — HIV ANTIBODY (ROUTINE TESTING W REFLEX): HIV SCREEN 4TH GENERATION: NONREACTIVE

## 2016-04-18 LAB — RPR: RPR Ser Ql: NONREACTIVE

## 2016-04-18 LAB — ANTIBODY SCREEN: Antibody Screen: NEGATIVE

## 2016-05-07 ENCOUNTER — Ambulatory Visit (INDEPENDENT_AMBULATORY_CARE_PROVIDER_SITE_OTHER): Payer: 59 | Admitting: Women's Health

## 2016-05-07 ENCOUNTER — Encounter: Payer: Self-pay | Admitting: Women's Health

## 2016-05-07 VITALS — BP 120/70 | HR 84 | Wt 186.0 lb

## 2016-05-07 DIAGNOSIS — Z331 Pregnant state, incidental: Secondary | ICD-10-CM

## 2016-05-07 DIAGNOSIS — Z1389 Encounter for screening for other disorder: Secondary | ICD-10-CM

## 2016-05-07 DIAGNOSIS — Z3493 Encounter for supervision of normal pregnancy, unspecified, third trimester: Secondary | ICD-10-CM

## 2016-05-07 LAB — POCT URINALYSIS DIPSTICK
Blood, UA: NEGATIVE
Glucose, UA: NEGATIVE
KETONES UA: NEGATIVE
Leukocytes, UA: NEGATIVE
Nitrite, UA: NEGATIVE
PROTEIN UA: NEGATIVE

## 2016-05-07 NOTE — Patient Instructions (Addendum)
Tdap Vaccine  It is recommended that you get the Tdap vaccine during the third trimester of EACH pregnancy to help protect your baby from getting pertussis (whooping cough)  27-36 weeks is the BEST time to do this so that you can pass the protection on to your baby. During pregnancy is better than after pregnancy, but if you are unable to get it during pregnancy it will be offered at the hospital.   You can get this vaccine at the health department or your family doctor  Everyone who will be around your baby should also be up-to-date on their vaccines. Adults (who are not pregnant) only need 1 dose of Tdap during adulthood.    Call the office [phone]) or go to [facility] if:  You begin to have strong, frequent contractions  Your water breaks.  Sometimes it is a big gush of fluid, sometimes it is just a trickle that keeps getting your panties wet or running down your legs  You have vaginal bleeding.  It is normal to have a small amount of spotting if your cervix was checked.   You don't feel your baby moving like normal.  If you don't, get you something to eat and drink and lay down and focus on feeling your baby move.  You should feel at least 10 movements in 2 hours.  If you don't, you should call the office or go to [facility] Preterm labor is when labor starts at less than 37 weeks of pregnancy. The normal length of a pregnancy is 39 to 41 weeks. CAUSES Often, there is no identifiable underlying cause as to why a woman goes into preterm labor. One of the most common known causes of preterm labor is infection. Infections of the uterus, cervix, vagina, amniotic sac, bladder, kidney, or even the lungs (pneumonia) can cause labor to start. Other suspected causes of preterm labor include:   Urogenital infections, such as yeast infections and bacterial vaginosis.   Uterine abnormalities (uterine shape, uterine septum, fibroids, or bleeding from the  placenta).   A cervix that has been operated on (it may fail to stay closed).   Malformations in the fetus.   Multiple gestations (twins, triplets, and so on).   Breakage of the amniotic sac.  RISK FACTORS  Having a previous history of preterm labor.   Having premature rupture of membranes (PROM).   Having a placenta that covers the opening of the cervix (placenta previa).   Having a placenta that separates from the uterus (placental abruption).   Having a cervix that is too weak to hold the fetus in the uterus (incompetent cervix).   Having too much fluid in the amniotic sac (polyhydramnios).   Taking illegal drugs or smoking while pregnant.   Not gaining enough weight while pregnant.   Being younger than 60 and older than 25 years old.   Having a low socioeconomic status.   Being African American. SYMPTOMS Signs and symptoms of preterm labor include:   Menstrual-like cramps, abdominal pain, or back pain.  Uterine contractions that are regular, as frequent as six in an hour, regardless of their intensity (may be mild or painful).  Contractions that start on the top of the uterus and spread down to the lower abdomen and back.   A sense of increased pelvic pressure.   A watery or bloody mucus discharge that comes from the vagina.  TREATMENT Depending on the length of the pregnancy and other circumstances,  your health care provider may suggest bed rest. If necessary, there are medicines that can be given to stop contractions and to mature the fetal lungs. If labor happens before 34 weeks of pregnancy, a prolonged hospital stay may be recommended. Treatment depends on the condition of both you and the fetus.  WHAT SHOULD YOU DO IF YOU THINK YOU ARE IN PRETERM LABOR? Call your health care provider right away. You will need to go to the hospital to get checked immediately. HOW CAN YOU PREVENT PRETERM LABOR IN FUTURE PREGNANCIES? You should:   Stop  smoking if you smoke.  Maintain healthy weight gain and avoid chemicals and drugs that are not necessary.  Be watchful for any type of infection.  Inform your health care provider if you have a known history of preterm labor.   This information is not intended to replace advice given to you by your health care provider. Make sure you discuss any questions you have with your health care provider.   Document Released: 02/07/2004 Document Revised: 07/20/2013 Document Reviewed: 12/20/2012 Elsevier Interactive Patient Education 2016 Elsevier Inc.  Heartburn During Pregnancy Heartburn is a burning sensation in the chest caused by stomach acid backing up into the esophagus. Heartburn is common in pregnancy because a certain hormone (progesterone) is released when a woman is pregnant. The progesterone hormone may relax the valve that separates the esophagus from the stomach. This allows acid to go up into the esophagus, causing heartburn. Heartburn may also happen in pregnancy because the enlarging uterus pushes up on the stomach, which pushes more acid into the esophagus. This is especially true in the later stages of pregnancy. Heartburn problems usually go away after giving birth. CAUSES  Heartburn is caused by stomach acid backing up into the esophagus. During pregnancy, this may result from various things, including:   The progesterone hormone.  Changing hormone levels.  The growing uterus pushing stomach acid upward.  Large meals.  Certain foods and drinks.  Exercise.  Increased acid production. SIGNS AND SYMPTOMS   Burning pain in the chest or lower throat.  Bitter taste in the mouth.  Coughing. DIAGNOSIS  Your health care provider will typically diagnose heartburn by taking a careful history of your concern. Blood tests may be done to check for a certain type of bacteria that is associated with heartburn. Sometimes, heartburn is diagnosed by prescribing a heartburn medicine  to see if the symptoms improve. In some cases, a procedure called an endoscopy may be done. In this procedure, a tube with a light and a camera on the end (endoscope) is used to examine the esophagus and the stomach. TREATMENT  Treatment will vary depending on the severity of your symptoms. Your health care provider may recommend:  Over-the-counter medicines (antacids, acid reducers) for mild heartburn.  Prescription medicines to decrease stomach acid or to protect your stomach lining.  Certain changes in your diet.  Elevating the head of your bed by putting blocks under the legs. This helps prevent stomach acid from backing up into the esophagus when you are lying down. HOME CARE INSTRUCTIONS   Only take over-the-counter or prescription medicines as directed by your health care provider.  Raise the head of your bed by putting blocks under the legs if instructed to do so by your health care provider. Sleeping with more pillows is not effective because it only changes the position of your head.  Do not exercise right after eating.  Avoid eating 2-3 hours before bed. Do  not lie down right after eating.  Eat small meals throughout the day instead of three large meals.  Identify foods and beverages that make your symptoms worse and avoid them. Foods you may want to avoid include:  Peppers.  Chocolate.  High-fat foods, including fried foods.  Spicy foods.  Garlic and onions.  Citrus fruits, including oranges, grapefruit, lemons, and limes.  Food containing tomatoes or tomato products.  Mint.  Carbonated and caffeinated drinks.  Vinegar. SEEK MEDICAL CARE IF:  You have abdominal pain of any kind.  You feel burning in your upper abdomen or chest, especially after eating or lying down.  You have nausea and vomiting.  Your stomach feels upset after you eat. SEEK IMMEDIATE MEDICAL CARE IF:   You have severe chest pain that goes down your arm or into your jaw or  neck.  You feel sweaty, dizzy, or light-headed.  You become short of breath.  You vomit blood.  You have difficulty or pain with swallowing.  You have bloody or black, tarry stools.  You have episodes of heartburn more than 3 times a week, for more than 2 weeks. MAKE SURE YOU:  Understand these instructions.  Will watch your condition.  Will get help right away if you are not doing well or get worse.   This information is not intended to replace advice given to you by your health care provider. Make sure you discuss any questions you have with your health care provider.   Document Released: 11/14/2000 Document Revised: 12/08/2014 Document Reviewed: 07/06/2013 Elsevier Interactive Patient Education [employer].

## 2016-05-07 NOTE — Progress Notes (Signed)
Low-risk OB appointment G1P0 [redacted]w[redacted]d Estimated Date of Delivery: 07/15/16 BP 120/70 mmHg  Pulse 84  Wt 186 lb (84.369 kg)  LMP 10/01/2015 (Exact Date)  BP, weight, and urine reviewed.  Refer to obstetrical flow sheet for FH & FHR.  Reports good fm.  Denies regular uc's, lof, vb, or uti s/s. Heartburn, taking zantac 150mg  q am, but flares back up around 11pm- can take bid. In middle of getting Hep B series- per CDC ok to get during pregnancy.  Reviewed pn2 results, ptl s/s, fkc. Recommended Tdap at HD/PCP per CDC guidelines.  Plan:  Continue routine obstetrical care  F/U in 2wk for OB appointment

## 2016-05-21 ENCOUNTER — Encounter: Payer: Self-pay | Admitting: Women's Health

## 2016-05-21 ENCOUNTER — Ambulatory Visit (INDEPENDENT_AMBULATORY_CARE_PROVIDER_SITE_OTHER): Payer: 59 | Admitting: Women's Health

## 2016-05-21 VITALS — BP 118/62 | HR 88 | Wt 189.0 lb

## 2016-05-21 DIAGNOSIS — Z3A32 32 weeks gestation of pregnancy: Secondary | ICD-10-CM

## 2016-05-21 DIAGNOSIS — Z3493 Encounter for supervision of normal pregnancy, unspecified, third trimester: Secondary | ICD-10-CM

## 2016-05-21 DIAGNOSIS — Z331 Pregnant state, incidental: Secondary | ICD-10-CM

## 2016-05-21 DIAGNOSIS — Z1389 Encounter for screening for other disorder: Secondary | ICD-10-CM

## 2016-05-21 DIAGNOSIS — Z3403 Encounter for supervision of normal first pregnancy, third trimester: Secondary | ICD-10-CM

## 2016-05-21 LAB — POCT URINALYSIS DIPSTICK
Blood, UA: NEGATIVE
Glucose, UA: NEGATIVE
NITRITE UA: NEGATIVE
PROTEIN UA: NEGATIVE

## 2016-05-21 NOTE — Patient Instructions (Signed)
Call the office ([phone]) or go to [facility] if:  You begin to have strong, frequent contractions  Your water breaks.  Sometimes it is a big gush of fluid, sometimes it is just a trickle that keeps getting your panties wet or running down your legs  You have vaginal bleeding.  It is normal to have a small amount of spotting if your cervix was checked.   You don't feel your baby moving like normal.  If you don't, get you something to eat and drink and lay down and focus on feeling your baby move.  You should feel at least 10 movements in 2 hours.  If you don't, you should call the office or go to [facility].    Preterm Labor Information Preterm labor is when labor starts at less than 37 weeks of pregnancy. The normal length of a pregnancy is 39 to 41 weeks. CAUSES Often, there is no identifiable underlying cause as to why a woman goes into preterm labor. One of the most common known causes of preterm labor is infection. Infections of the uterus, cervix, vagina, amniotic sac, bladder, kidney, or even the lungs (pneumonia) can cause labor to start. Other suspected causes of preterm labor include:   Urogenital infections, such as yeast infections and bacterial vaginosis.   Uterine abnormalities (uterine shape, uterine septum, fibroids, or bleeding from the placenta).   A cervix that has been operated on (it may fail to stay closed).   Malformations in the fetus.   Multiple gestations (twins, triplets, and so on).   Breakage of the amniotic sac.  RISK FACTORS  Having a previous history of preterm labor.   Having premature rupture of membranes (PROM).   Having a placenta that covers the opening of the cervix (placenta previa).   Having a placenta that separates from the uterus (placental abruption).   Having a cervix that is too weak to hold the fetus in the uterus (incompetent cervix).   Having too much fluid in the amniotic sac (polyhydramnios).   Taking  illegal drugs or smoking while pregnant.   Not gaining enough weight while pregnant.   Being younger than 18 and older than 25 years old.   Having a low socioeconomic status.   Being African American. SYMPTOMS Signs and symptoms of preterm labor include:   Menstrual-like cramps, abdominal pain, or back pain.  Uterine contractions that are regular, as frequent as six in an hour, regardless of their intensity (may be mild or painful).  Contractions that start on the top of the uterus and spread down to the lower abdomen and back.   A sense of increased pelvic pressure.   A watery or bloody mucus discharge that comes from the vagina.  TREATMENT Depending on the length of the pregnancy and other circumstances, your health care provider may suggest bed rest. If necessary, there are medicines that can be given to stop contractions and to mature the fetal lungs. If labor happens before 34 weeks of pregnancy, a prolonged hospital stay may be recommended. Treatment depends on the condition of both you and the fetus.  WHAT SHOULD YOU DO IF YOU THINK YOU ARE IN PRETERM LABOR? Call your health care provider right away. You will need to go to the hospital to get checked immediately. HOW CAN YOU PREVENT PRETERM LABOR IN FUTURE PREGNANCIES? You should:   Stop smoking if you smoke.  Maintain healthy weight gain and avoid chemicals and drugs that are not necessary.  Be watchful for   any type of infection.  Inform your health care provider if you have a known history of preterm labor.   This information is not intended to replace advice given to you by your health care provider. Make sure you discuss any questions you have with your health care provider.   Document Released: 02/07/2004 Document Revised: 07/20/2013 Document Reviewed: 12/20/2012 Elsevier Interactive Patient Education 2016 Elsevier Inc[employer]  

## 2016-05-21 NOTE — Progress Notes (Signed)
Low-risk OB appointment G1P0 106w1d Estimated Date of Delivery: 07/15/16 BP 118/62 mmHg  Pulse 88  Wt 189 lb (85.73 kg)  LMP 10/01/2015 (Exact Date)  BP, weight, and urine reviewed.  Refer to obstetrical flow sheet for FH & FHR.  Reports good fm.  Denies regular uc's, lof, vb, or uti s/s. No complaints. Reviewed ptl s/s, fkc. Plan:  Continue routine obstetrical care  F/U in 2wks for OB appointment

## 2016-06-05 ENCOUNTER — Ambulatory Visit (INDEPENDENT_AMBULATORY_CARE_PROVIDER_SITE_OTHER): Payer: 59 | Admitting: Obstetrics & Gynecology

## 2016-06-05 ENCOUNTER — Encounter: Payer: Self-pay | Admitting: Obstetrics & Gynecology

## 2016-06-05 ENCOUNTER — Other Ambulatory Visit: Payer: Self-pay | Admitting: Obstetrics & Gynecology

## 2016-06-05 VITALS — BP 120/80 | HR 80 | Wt 197.0 lb

## 2016-06-05 DIAGNOSIS — L980 Pyogenic granuloma: Secondary | ICD-10-CM

## 2016-06-05 DIAGNOSIS — L988 Other specified disorders of the skin and subcutaneous tissue: Secondary | ICD-10-CM

## 2016-06-05 DIAGNOSIS — Z3403 Encounter for supervision of normal first pregnancy, third trimester: Secondary | ICD-10-CM

## 2016-06-05 DIAGNOSIS — Z1389 Encounter for screening for other disorder: Secondary | ICD-10-CM

## 2016-06-05 DIAGNOSIS — Z331 Pregnant state, incidental: Secondary | ICD-10-CM

## 2016-06-05 DIAGNOSIS — N649 Disorder of breast, unspecified: Secondary | ICD-10-CM

## 2016-06-05 DIAGNOSIS — Z3A35 35 weeks gestation of pregnancy: Secondary | ICD-10-CM

## 2016-06-05 LAB — POCT URINALYSIS DIPSTICK
GLUCOSE UA: NEGATIVE
Ketones, UA: NEGATIVE
NITRITE UA: NEGATIVE
Protein, UA: NEGATIVE
RBC UA: NEGATIVE

## 2016-06-05 MED ORDER — TERCONAZOLE 0.4 % VA CREA: 1.0000 | TOPICAL_CREAM | Freq: Every day | VAGINAL | Status: DC

## 2016-06-05 NOTE — Progress Notes (Signed)
PROCEDURE NOTE  PRE-OP DIAGNOSIS:  Vascular skin lesion enlarging on the left breast  PROCEDURE:  Skin Lesion Excision(s)  INDICATIONS:  Sydney Trujillo is a 25 y.o. female who presents for minor skin surgery.  The patient understands all risks, benefits, indications, potential complications, and alternatives, and freely consents for the procedure.  The patient also understands the option of performing no surgery, the risk for scarring, and the technique of the procedure.  ANESTHESIA:  Local.  TECHNIQUE:  After informed consent was obtained, and after the skin was prepped and draped, 1% lidocaine without epinephrine for anesthetic was injected around and underneath the site.   elliptical excision in total was performed.  A dressing was applied and wound care instructions were provided.  [name] tolerated the procedure well and without complications.  The patient will be alert for any signs of cutaneous infection and will follow up as instructed.  G1P0 [redacted]w[redacted]d Estimated Date of Delivery: 07/15/16  Blood pressure 120/80, pulse 80, weight 197 lb (89.4 kg), last menstrual period 10/01/2015, not currently breastfeeding.   BP weight and urine results all reviewed and noted.  Please refer to the obstetrical flow sheet for the fundal height and fetal heart rate documentation:  Patient reports good fetal movement, denies any bleeding and no rupture of membranes symptoms or regular contractions. Patient is without complaints. All questions were answered.  Orders Placed This Encounter  Procedures  . POCT urinalysis dipstick  . PR EXC SKIN BENIG 1.1-2 CM TRUNK,ARM,LEG    Plan:  Continued routine obstetrical care,   Return in about 7 days (around 06/12/2016) for Follow up, with Dr Elonda Husky.

## 2016-06-12 ENCOUNTER — Ambulatory Visit (INDEPENDENT_AMBULATORY_CARE_PROVIDER_SITE_OTHER): Payer: 59 | Admitting: Obstetrics & Gynecology

## 2016-06-12 VITALS — BP 132/80 | HR 108 | Wt 193.0 lb

## 2016-06-12 DIAGNOSIS — Z331 Pregnant state, incidental: Secondary | ICD-10-CM

## 2016-06-12 DIAGNOSIS — Z1389 Encounter for screening for other disorder: Secondary | ICD-10-CM

## 2016-06-12 DIAGNOSIS — Z3A36 36 weeks gestation of pregnancy: Secondary | ICD-10-CM

## 2016-06-12 DIAGNOSIS — Z3403 Encounter for supervision of normal first pregnancy, third trimester: Secondary | ICD-10-CM

## 2016-06-12 DIAGNOSIS — Z3493 Encounter for supervision of normal pregnancy, unspecified, third trimester: Secondary | ICD-10-CM

## 2016-06-12 LAB — POCT URINALYSIS DIPSTICK
Blood, UA: NEGATIVE
GLUCOSE UA: NEGATIVE
Ketones, UA: NEGATIVE
LEUKOCYTES UA: NEGATIVE
Nitrite, UA: NEGATIVE
PROTEIN UA: NEGATIVE

## 2016-06-12 NOTE — Progress Notes (Signed)
Sutures removed x4, healed well, continue with the neosporin  G1P0 [redacted]w[redacted]d Estimated Date of Delivery: 07/15/16  Blood pressure 132/80, pulse (!) 108, weight 193 lb (87.5 kg), last menstrual period 10/01/2015, not currently breastfeeding.   BP weight and urine results all reviewed and noted.  Please refer to the obstetrical flow sheet for the fundal height and fetal heart rate documentation:  Patient reports good fetal movement, denies any bleeding and no rupture of membranes symptoms or regular contractions. Patient is without complaints. All questions were answered.  Orders Placed This Encounter  Procedures  . POCT urinalysis dipstick    Plan:  Continued routine obstetrical care, has URI recommend alka seltzer plus day/night  Return in about 1 week (around 06/19/2016) for LROB, do GBS culture also.  Do GBS at next weeks visit  Pathology report from last week is still pending

## 2016-06-19 ENCOUNTER — Encounter: Payer: Self-pay | Admitting: Advanced Practice Midwife

## 2016-06-19 ENCOUNTER — Ambulatory Visit (INDEPENDENT_AMBULATORY_CARE_PROVIDER_SITE_OTHER): Payer: 59 | Admitting: Advanced Practice Midwife

## 2016-06-19 VITALS — BP 130/80 | HR 76 | Wt 196.0 lb

## 2016-06-19 DIAGNOSIS — Z3493 Encounter for supervision of normal pregnancy, unspecified, third trimester: Secondary | ICD-10-CM

## 2016-06-19 DIAGNOSIS — Z3A37 37 weeks gestation of pregnancy: Secondary | ICD-10-CM

## 2016-06-19 DIAGNOSIS — Z331 Pregnant state, incidental: Secondary | ICD-10-CM

## 2016-06-19 DIAGNOSIS — Z3403 Encounter for supervision of normal first pregnancy, third trimester: Secondary | ICD-10-CM

## 2016-06-19 DIAGNOSIS — Z1389 Encounter for screening for other disorder: Secondary | ICD-10-CM

## 2016-06-19 LAB — POCT URINALYSIS DIPSTICK
Blood, UA: NEGATIVE
GLUCOSE UA: NEGATIVE
KETONES UA: NEGATIVE
LEUKOCYTES UA: NEGATIVE
NITRITE UA: NEGATIVE
Protein, UA: NEGATIVE

## 2016-06-19 NOTE — Progress Notes (Signed)
G1P0 [redacted]w[redacted]d Estimated Date of Delivery: 07/15/16  Blood pressure 130/80, pulse 76, weight 196 lb (88.905 kg), last menstrual period 10/01/2015.   BP weight and urine results all reviewed and noted.  Please refer to the obstetrical flow sheet for the fundal height and fetal heart rate documentation:  Patient reports good fetal movement, denies any bleeding and no rupture of membranes symptoms or regular contractions. Patient is without complaints. All questions were answered.  Orders Placed This Encounter  Procedures  . POCT urinalysis dipstick    Plan:  Continued routine obstetrical care,   Return in about 1 week (around 06/26/2016) for LROB.

## 2016-06-19 NOTE — Patient Instructions (Signed)

## 2016-06-25 ENCOUNTER — Encounter: Payer: Self-pay | Admitting: Advanced Practice Midwife

## 2016-06-25 ENCOUNTER — Ambulatory Visit (INDEPENDENT_AMBULATORY_CARE_PROVIDER_SITE_OTHER): Payer: 59 | Admitting: Advanced Practice Midwife

## 2016-06-25 VITALS — BP 120/80 | HR 84 | Wt 197.4 lb

## 2016-06-25 DIAGNOSIS — Z3403 Encounter for supervision of normal first pregnancy, third trimester: Secondary | ICD-10-CM

## 2016-06-25 DIAGNOSIS — Z3493 Encounter for supervision of normal pregnancy, unspecified, third trimester: Secondary | ICD-10-CM

## 2016-06-25 DIAGNOSIS — Z1159 Encounter for screening for other viral diseases: Secondary | ICD-10-CM

## 2016-06-25 DIAGNOSIS — Z3A37 37 weeks gestation of pregnancy: Secondary | ICD-10-CM

## 2016-06-25 DIAGNOSIS — Z118 Encounter for screening for other infectious and parasitic diseases: Secondary | ICD-10-CM

## 2016-06-25 DIAGNOSIS — Z3685 Encounter for antenatal screening for Streptococcus B: Secondary | ICD-10-CM

## 2016-06-25 DIAGNOSIS — Z1389 Encounter for screening for other disorder: Secondary | ICD-10-CM

## 2016-06-25 DIAGNOSIS — Z331 Pregnant state, incidental: Secondary | ICD-10-CM

## 2016-06-25 LAB — POCT URINALYSIS DIPSTICK
GLUCOSE UA: NEGATIVE
KETONES UA: NEGATIVE
Leukocytes, UA: NEGATIVE
Nitrite, UA: NEGATIVE
Protein, UA: NEGATIVE
RBC UA: NEGATIVE

## 2016-06-25 MED ORDER — AMOXICILLIN 500 MG PO CAPS: 500.0000 mg | ORAL_CAPSULE | Freq: Three times a day (TID) | ORAL | 0 refills | Status: DC

## 2016-06-25 NOTE — Progress Notes (Signed)
G1P0 [redacted]w[redacted]d Estimated Date of Delivery: 07/15/16  Blood pressure 120/80, pulse 84, weight 197 lb 6.4 oz (89.5 kg), last menstrual period 10/01/2015.   BP weight and urine results all reviewed and noted.  Please refer to the obstetrical flow sheet for the fundal height and fetal heart rate documentation:  Patient reports good fetal movement, denies any bleeding and no rupture of membranes symptoms or regular contractions. Patient is without complaints. All questions were answered.  Orders Placed This Encounter  Procedures  . POCT urinalysis dipstick    Plan:  Continued routine obstetrical care,     Return in about 1 week (around 07/02/2016) for LROB.

## 2016-06-27 LAB — GC/CHLAMYDIA PROBE AMP
CHLAMYDIA, DNA PROBE: NEGATIVE
NEISSERIA GONORRHOEAE BY PCR: NEGATIVE

## 2016-06-27 LAB — STREP GP B NAA: STREP GROUP B AG: POSITIVE — AB

## 2016-06-30 ENCOUNTER — Ambulatory Visit (INDEPENDENT_AMBULATORY_CARE_PROVIDER_SITE_OTHER): Payer: 59 | Admitting: Obstetrics & Gynecology

## 2016-06-30 ENCOUNTER — Encounter (HOSPITAL_COMMUNITY): Payer: Self-pay | Admitting: *Deleted

## 2016-06-30 ENCOUNTER — Inpatient Hospital Stay (HOSPITAL_COMMUNITY)
Admission: AD | Admit: 2016-06-30 | Discharge: 2016-06-30 | Disposition: A | Discharge status: Home / Self Care | Payer: 59 | Source: Ambulatory Visit | Attending: Obstetrics and Gynecology | Admitting: Obstetrics and Gynecology

## 2016-06-30 VITALS — BP 144/92 | HR 98 | Wt 197.0 lb

## 2016-06-30 DIAGNOSIS — M545 Low back pain: Secondary | ICD-10-CM

## 2016-06-30 DIAGNOSIS — N949 Unspecified condition associated with female genital organs and menstrual cycle: Secondary | ICD-10-CM | POA: Diagnosis not present

## 2016-06-30 DIAGNOSIS — O133 Gestational [pregnancy-induced] hypertension without significant proteinuria, third trimester: Secondary | ICD-10-CM | POA: Diagnosis not present

## 2016-06-30 DIAGNOSIS — O163 Unspecified maternal hypertension, third trimester: Secondary | ICD-10-CM

## 2016-06-30 DIAGNOSIS — R1030 Lower abdominal pain, unspecified: Secondary | ICD-10-CM

## 2016-06-30 DIAGNOSIS — O99613 Diseases of the digestive system complicating pregnancy, third trimester: Secondary | ICD-10-CM | POA: Insufficient documentation

## 2016-06-30 DIAGNOSIS — K219 Gastro-esophageal reflux disease without esophagitis: Secondary | ICD-10-CM | POA: Insufficient documentation

## 2016-06-30 DIAGNOSIS — Z331 Pregnant state, incidental: Secondary | ICD-10-CM

## 2016-06-30 DIAGNOSIS — Z3493 Encounter for supervision of normal pregnancy, unspecified, third trimester: Secondary | ICD-10-CM

## 2016-06-30 DIAGNOSIS — Z1389 Encounter for screening for other disorder: Secondary | ICD-10-CM | POA: Diagnosis not present

## 2016-06-30 DIAGNOSIS — O479 False labor, unspecified: Secondary | ICD-10-CM

## 2016-06-30 DIAGNOSIS — Z87891 Personal history of nicotine dependence: Secondary | ICD-10-CM | POA: Diagnosis not present

## 2016-06-30 DIAGNOSIS — R102 Pelvic and perineal pain: Secondary | ICD-10-CM

## 2016-06-30 DIAGNOSIS — R030 Elevated blood-pressure reading, without diagnosis of hypertension: Secondary | ICD-10-CM | POA: Diagnosis not present

## 2016-06-30 DIAGNOSIS — O26893 Other specified pregnancy related conditions, third trimester: Principal | ICD-10-CM

## 2016-06-30 DIAGNOSIS — O10013 Pre-existing essential hypertension complicating pregnancy, third trimester: Secondary | ICD-10-CM | POA: Diagnosis not present

## 2016-06-30 DIAGNOSIS — O471 False labor at or after 37 completed weeks of gestation: Secondary | ICD-10-CM | POA: Diagnosis not present

## 2016-06-30 DIAGNOSIS — Z3A37 37 weeks gestation of pregnancy: Secondary | ICD-10-CM | POA: Insufficient documentation

## 2016-06-30 LAB — URINE MICROSCOPIC-ADD ON: RBC / HPF: NONE SEEN RBC/hpf (ref 0–5)

## 2016-06-30 LAB — CBC
HCT: 34.7 % — ABNORMAL LOW (ref 36.0–46.0)
Hemoglobin: 11.6 g/dL — ABNORMAL LOW (ref 12.0–15.0)
MCH: 29 pg (ref 26.0–34.0)
MCHC: 33.4 g/dL (ref 30.0–36.0)
MCV: 86.8 fL (ref 78.0–100.0)
PLATELETS: 201 10*3/uL (ref 150–400)
RBC: 4 MIL/uL (ref 3.87–5.11)
RDW: 14.6 % (ref 11.5–15.5)
WBC: 10 10*3/uL (ref 4.0–10.5)

## 2016-06-30 LAB — URINALYSIS, ROUTINE W REFLEX MICROSCOPIC
BILIRUBIN URINE: NEGATIVE
GLUCOSE, UA: NEGATIVE mg/dL
HGB URINE DIPSTICK: NEGATIVE
KETONES UR: NEGATIVE mg/dL
Nitrite: NEGATIVE
PH: 7 (ref 5.0–8.0)
Protein, ur: NEGATIVE mg/dL
Specific Gravity, Urine: 1.01 (ref 1.005–1.030)

## 2016-06-30 LAB — COMPREHENSIVE METABOLIC PANEL
ALK PHOS: 126 U/L (ref 38–126)
ALT: 16 U/L (ref 14–54)
AST: 21 U/L (ref 15–41)
Albumin: 3.1 g/dL — ABNORMAL LOW (ref 3.5–5.0)
Anion gap: 7 (ref 5–15)
BUN: 7 mg/dL (ref 6–20)
CALCIUM: 9.3 mg/dL (ref 8.9–10.3)
CO2: 19 mmol/L — ABNORMAL LOW (ref 22–32)
CREATININE: 0.55 mg/dL (ref 0.44–1.00)
Chloride: 108 mmol/L (ref 101–111)
Glucose, Bld: 98 mg/dL (ref 65–99)
Potassium: 3.5 mmol/L (ref 3.5–5.1)
Sodium: 134 mmol/L — ABNORMAL LOW (ref 135–145)
Total Bilirubin: 0.5 mg/dL (ref 0.3–1.2)
Total Protein: 6.1 g/dL — ABNORMAL LOW (ref 6.5–8.1)

## 2016-06-30 LAB — PROTEIN / CREATININE RATIO, URINE
CREATININE, URINE: 79 mg/dL
Protein Creatinine Ratio: 0.13 mg/mg{Cre} (ref 0.00–0.15)
TOTAL PROTEIN, URINE: 10 mg/dL

## 2016-06-30 NOTE — Discharge Instructions (Signed)
Reasons to return to MAU:  1.  Contractions are  5 minutes apart or less, each last 1 minute, these have been going on for 1-2 hours, and you cannot walk or talk during them 2.  You have a large gush of fluid, or a trickle of fluid that will not stop and you have to wear a pad 3.  You have bleeding that is bright red, heavier than spotting--like menstrual bleeding (spotting can be normal in early labor or after a check of your cervix) 4.  You do not feel the baby moving like he/she normally does  Hypertension During Pregnancy Hypertension, or high blood pressure, is when there is extra pressure inside your blood vessels that carry blood from the heart to the rest of your body (arteries). It can happen at any time in life, including pregnancy. Hypertension during pregnancy can cause problems for you and your baby. Your baby might not weigh as much as he or she should at birth or might be born early (premature). Very bad cases of hypertension during pregnancy can be life-threatening.  Different types of hypertension can occur during pregnancy. These include:  Chronic hypertension. This happens when a woman has hypertension before pregnancy and it continues during pregnancy.  Gestational hypertension. This is when hypertension develops during pregnancy.  Preeclampsia or toxemia of pregnancy. This is a very serious type of hypertension that develops only during pregnancy. It affects the whole body and can be very dangerous for both mother and baby.  Gestational hypertension and preeclampsia usually go away after your baby is born. Your blood pressure will likely stabilize within 6 weeks. Women who have hypertension during pregnancy have a greater chance of developing hypertension later in life or with future pregnancies. RISK FACTORS There are certain factors that make it more likely for you to develop hypertension during pregnancy. These include:  Having hypertension before pregnancy.  Having  hypertension during a previous pregnancy.  Being overweight.  Being older than 40 years.  Being pregnant with more than one baby.  Having diabetes or kidney problems. SIGNS AND SYMPTOMS Chronic and gestational hypertension rarely cause symptoms. Preeclampsia has symptoms, which may include:  Increased protein in your urine. Your health care provider will check for this at every prenatal visit.  Swelling of your hands and face.  Rapid weight gain.  Headaches.  Visual changes.  Being bothered by light.  Abdominal pain, especially in the upper right area.  Chest pain.  Shortness of breath.  Increased reflexes.  Seizures. These occur with a more severe form of preeclampsia, called eclampsia. DIAGNOSIS  You may be diagnosed with hypertension during a regular prenatal exam. At each prenatal visit, you may have:  Your blood pressure checked.  A urine test to check for protein in your urine. The type of hypertension you are diagnosed with depends on when you developed it. It also depends on your specific blood pressure reading.  Developing hypertension before 20 weeks of pregnancy is consistent with chronic hypertension.  Developing hypertension after 20 weeks of pregnancy is consistent with gestational hypertension.  Hypertension with increased urinary protein is diagnosed as preeclampsia.  Blood pressure measurements that stay above 0000000 systolic or [id number] diastolic are a sign of severe preeclampsia. TREATMENT Treatment for hypertension during pregnancy varies. Treatment depends on the type of hypertension and how serious it is.  If you take medicine for chronic hypertension, you may need to switch medicines.  Medicines called ACE inhibitors should not be taken during pregnancy.  Low-dose aspirin may be suggested for women who have risk factors for preeclampsia.  If you have gestational hypertension, you may need to take a blood pressure medicine that is safe during  pregnancy. Your health care provider will recommend the correct medicine.  If you have severe preeclampsia, you may need to be in the hospital. Health care providers will watch you and your baby very closely. You also may need to take medicine called magnesium sulfate to prevent seizures and lower blood pressure.  Sometimes, an early delivery is needed. This may be the case if the condition worsens. It would be done to protect you and your baby. The only cure for preeclampsia is delivery.  Your health care provider may recommend that you take one low-dose aspirin (81 mg) each day to help prevent high blood pressure during your pregnancy if you are at risk for preeclampsia. You may be at risk for preeclampsia if:  You had preeclampsia or eclampsia during a previous pregnancy.  Your baby did not grow as expected during a previous pregnancy.  You experienced preterm birth with a previous pregnancy.  You experienced a separation of the placenta from the uterus (placental abruption) during a previous pregnancy.  You experienced the loss of your baby during a previous pregnancy.  You are pregnant with more than one baby.  You have other medical conditions, such as diabetes or an autoimmune disease. HOME CARE INSTRUCTIONS  Schedule and keep all of your regular prenatal care appointments. This is important.  Take medicines only as directed by your health care provider. Tell your health care provider about all medicines you take.  Eat as little salt as possible.  Get regular exercise.  Do not drink alcohol.  Do not use tobacco products.  Do not drink products with caffeine.  Lie on your left side when resting. SEEK IMMEDIATE MEDICAL CARE IF:  You have severe abdominal pain.  You have sudden swelling in your hands, ankles, or face.  You gain 4 pounds (1.8 kg) or more in 1 week.  You vomit repeatedly.  You have vaginal bleeding.  You do not feel your baby moving as much.  You  have a headache.  You have blurred or double vision.  You have muscle twitching or spasms.  You have shortness of breath.  You have blue fingernails or lips.  You have blood in your urine. MAKE SURE YOU:  Understand these instructions.  Will watch your condition.  Will get help right away if you are not doing well or get worse.   This information is not intended to replace advice given to you by your health care provider. Make sure you discuss any questions you have with your health care provider.   Document Released: 08/05/2011 Document Revised: 12/08/2014 Document Reviewed: 06/16/2013 Elsevier Interactive Patient Education [employer].

## 2016-06-30 NOTE — Progress Notes (Signed)
Work in [employer] visit  Pt started with pressure in back and radiating to front, mostly constant, worse with activity, better with sitting or lying down No bleeding No ROM  cervix TFT/th/-2/soft  This is not labor but her BP is up which may be a result of her discomfort However, it is enough to warrant a lab eval and observation to see what BP does over some time  Will send to MAU for evaluation with CBC, urine protein/creatinine ratio, CMP and some BP readings Pt aware of the possible concerns and [facility]  If nothing comes of this, recommend keep scheduled appt which is this [place]

## 2016-06-30 NOTE — MAU Provider Note (Signed)
Chief Complaint:  Hypertension   None     HPI: Sydney Trujillo is a 25 y.o. G1P0 at 74w6dwho presents to maternity admissions sent from [facility] for elevated BP in office today. She presented to the office reporting contractions but no evidence of labor was found (cervix FT/thick/-2) and BP was mildly elevated for the first time in this pregnancy.  She was sent to MAU for further evaluation.  She denies h/a, epigastric pain, or visual disturbances. Her abdominal cramping is better when sitting down and is mild at present, increasing when she is standing up or walking. She has not tried any treatments for pain and it is intermittent, lower abdominal, and improving over time.  She reports good fetal movement, denies LOF, vaginal bleeding, vaginal itching/burning, urinary symptoms, dizziness, n/v, or fever/chills.    HPI  Past Medical History: Past Medical History:  Diagnosis Date  . Abdominal pain 09/07/2013  . Constipation 11/08/2015  . Irregular bleeding 08/30/2013  . IUD (intrauterine device) in place 10/18/2013   07/2009 IUD  . Pregnant 11/08/2015  . Reflux 11/08/2015    Past obstetric history: OB History  Gravida Para Term Preterm AB Living  1            SAB TAB Ectopic Multiple Live Births               # Outcome Date GA Lbr Len/2nd Weight Sex Delivery Anes PTL Lv  1 Current               Past Surgical History: Past Surgical History:  Procedure Laterality Date  . BREAST BIOPSY  10/17/2011   Procedure: BREAST BIOPSY;  Surgeon: Donato Heinz;  Location: [facility];  Service: General;  Laterality: Left;    Family History: Family History  Problem Relation Age of Onset  . Hypertension Mother   . Diabetes Father   . Hypertension Father   . Diabetes Paternal Grandmother   . Hypertension Paternal Grandmother   . Fibromyalgia Paternal Grandmother   . Anesthesia problems Neg Hx   . Hypotension Neg Hx   . Malignant hyperthermia Neg Hx   . Pseudochol deficiency Neg Hx      Social History: Social History  Substance Use Topics  . Smoking status: Former Smoker    Packs/day: 0.50    Years: 1.00    Types: Cigarettes    Quit date: 10/12/2009  . Smokeless tobacco: Never Used  . Alcohol use No    Allergies:  Allergies  Allergen Reactions  . Latex Itching    Meds:  Prescriptions Prior to Admission  Medication Sig Dispense Refill Last Dose  . amoxicillin (AMOXIL) 500 MG capsule Take 1 capsule (500 mg total) by mouth 3 (three) times daily. 21 capsule 0 06/30/2016 at Unknown time  . loratadine (CLARITIN) 10 MG tablet Take 10 mg by mouth daily.   06/30/2016 at Unknown time  . Prenatal Vit-Fe Fumarate-FA (MULTIVITAMIN-PRENATAL) 27-0.8 MG TABS tablet Take 1 tablet by mouth daily at 12 noon.   06/29/2016 at Unknown time  . ranitidine (ZANTAC) 75 MG tablet Take 75 mg by mouth 2 (two) times daily.   06/30/2016 at Unknown time    ROS:  Review of Systems  Constitutional: Negative for chills, fatigue and fever.  Eyes: Negative for visual disturbance.  Respiratory: Negative for shortness of breath.   Cardiovascular: Negative for chest pain.  Gastrointestinal: Positive for abdominal pain. Negative for nausea and vomiting.  Genitourinary: Negative for difficulty urinating, dysuria, flank pain,  pelvic pain, vaginal bleeding, vaginal discharge and vaginal pain.  Neurological: Negative for dizziness and headaches.  Psychiatric/Behavioral: Negative.      I have reviewed patient's Past Medical Hx, Surgical Hx, Family Hx, Social Hx, medications and allergies.   Physical Exam  Patient Vitals for the past 24 hrs:  BP Temp Temp src Pulse Resp  06/30/16 1744 129/93 - - 108 -  06/30/16 1730 116/69 - - 85 -  06/30/16 1714 114/70 - - 105 -  06/30/16 1700 112/63 - - 106 -  06/30/16 1645 125/67 - - 96 -  06/30/16 1633 133/82 - - 112 -  06/30/16 1557 129/85 98.3 F (36.8 C) Oral 109 18   Constitutional: Well-developed, well-nourished female in no acute distress.   Cardiovascular: normal rate Respiratory: normal effort GI: Abd soft, non-tender, gravid appropriate for gestational age.  MS: Extremities nontender, no edema, normal ROM Neurologic: Alert and oriented x 4.  GU: Neg CVAT.  Cervical exam deferred, done in office today (FT/thick/-2)     FHT:  Baseline 135 , moderate variability, accelerations present, no decelerations Contractions: q 3-6 mins, irregular   Labs: Results for orders placed or performed during the hospital encounter of 06/30/16 (from the past 24 hour(s))  Protein / creatinine ratio, urine     Status: None   Collection Time: 06/30/16  3:58 PM  Result Value Ref Range   Creatinine, Urine 79.00 mg/dL   Total Protein, Urine 10 mg/dL   Protein Creatinine Ratio 0.13 0.00 - 0.15 mg/mg[Cre]  Urinalysis, Routine w reflex microscopic (not at [facility])     Status: Abnormal   Collection Time: 06/30/16  3:58 PM  Result Value Ref Range   Color, Urine YELLOW YELLOW   APPearance CLOUDY (A) CLEAR   Specific Gravity, Urine 1.010 1.005 - 1.030   pH 7.0 5.0 - 8.0   Glucose, UA NEGATIVE NEGATIVE mg/dL   Hgb urine dipstick NEGATIVE NEGATIVE   Bilirubin Urine NEGATIVE NEGATIVE   Ketones, ur NEGATIVE NEGATIVE mg/dL   Protein, ur NEGATIVE NEGATIVE mg/dL   Nitrite NEGATIVE NEGATIVE   Leukocytes, UA MODERATE (A) NEGATIVE  Urine microscopic-add on     Status: Abnormal   Collection Time: 06/30/16  3:58 PM  Result Value Ref Range   Squamous Epithelial / LPF 6-30 (A) NONE SEEN   WBC, UA 0-5 0 - 5 WBC/hpf   RBC / HPF NONE SEEN 0 - 5 RBC/hpf   Bacteria, UA FEW (A) NONE SEEN   Urine-Other YEAST PRESENT   CBC     Status: Abnormal   Collection Time: 06/30/16  4:37 PM  Result Value Ref Range   WBC 10.0 4.0 - 10.5 K/uL   RBC 4.00 3.87 - 5.11 MIL/uL   Hemoglobin 11.6 (L) 12.0 - 15.0 g/dL   HCT 34.7 (L) 36.0 - 46.0 %   MCV 86.8 78.0 - 100.0 fL   MCH 29.0 26.0 - 34.0 pg   MCHC 33.4 30.0 - 36.0 g/dL   RDW 14.6 11.5 - 15.5 %   Platelets 201 150 -  400 K/uL  Comprehensive metabolic panel     Status: Abnormal   Collection Time: 06/30/16  4:37 PM  Result Value Ref Range   Sodium 134 (L) 135 - 145 mmol/L   Potassium 3.5 3.5 - 5.1 mmol/L   Chloride 108 101 - 111 mmol/L   CO2 19 (L) 22 - 32 mmol/L   Glucose, Bld 98 65 - 99 mg/dL   BUN 7 6 - 20 mg/dL  Creatinine, Ser 0.55 0.44 - 1.00 mg/dL   Calcium 9.3 8.9 - 10.3 mg/dL   Total Protein 6.1 (L) 6.5 - 8.1 g/dL   Albumin 3.1 (L) 3.5 - 5.0 g/dL   AST 21 15 - 41 U/L   ALT 16 14 - 54 U/L   Alkaline Phosphatase 126 38 - 126 U/L   Total Bilirubin 0.5 0.3 - 1.2 mg/dL   GFR calc non Af Amer >60 >60 mL/min   GFR calc Af Amer >60 >60 mL/min   Anion gap 7 5 - 15   A/Positive/-- (01/19 0920)  Imaging:  No results found.  MAU Course/MDM: I have ordered labs and reviewed results.  Consult Dr Elly Modena.  With predominately normal BPs, no symptoms, and normal labwork, no clear evidence of GHTN or preeclampsia today.  Plan for close f/u in office this week for BP check.  Preeclampsia and labor  precautions given. Pt stable at time of discharge.  Assessment: 1. Hypertension affecting pregnancy, third trimester   2. Supervision of normal pregnancy, third trimester   3. Threatened labor at term     Plan: Discharge home with preeclampsia precautions Labor precautions and fetal kick counts  Follow-up Information    [facility]. Schedule an appointment as soon as possible for a visit in 3 day(s).   Why:  For blood pressure check this week Contact information: [place] [id number] [phone]           Medication List    TAKE these medications   amoxicillin 500 MG capsule Commonly known as:  AMOXIL Take 1 capsule (500 mg total) by mouth 3 (three) times daily.   loratadine 10 MG tablet Commonly known as:  CLARITIN Take 10 mg by mouth daily.   multivitamin-prenatal 27-0.8 MG Tabs tablet Take 1 tablet by mouth daily at 12 noon.    ranitidine 75 MG tablet Commonly known as:  ZANTAC Take 75 mg by mouth 2 (two) times daily.       Fatima Blank Certified Nurse-Midwife 06/30/2016 5:51 PM

## 2016-06-30 NOTE — MAU Note (Signed)
Assumed care of patient.

## 2016-07-02 ENCOUNTER — Encounter: Payer: Self-pay | Admitting: Advanced Practice Midwife

## 2016-07-02 ENCOUNTER — Ambulatory Visit (INDEPENDENT_AMBULATORY_CARE_PROVIDER_SITE_OTHER): Payer: 59 | Admitting: Advanced Practice Midwife

## 2016-07-02 VITALS — BP 130/80 | HR 80 | Wt 196.0 lb

## 2016-07-02 DIAGNOSIS — Z3A38 38 weeks gestation of pregnancy: Secondary | ICD-10-CM

## 2016-07-02 DIAGNOSIS — Z3493 Encounter for supervision of normal pregnancy, unspecified, third trimester: Secondary | ICD-10-CM

## 2016-07-02 DIAGNOSIS — Z331 Pregnant state, incidental: Secondary | ICD-10-CM

## 2016-07-02 DIAGNOSIS — Z1389 Encounter for screening for other disorder: Secondary | ICD-10-CM

## 2016-07-02 DIAGNOSIS — Z3403 Encounter for supervision of normal first pregnancy, third trimester: Secondary | ICD-10-CM

## 2016-07-02 LAB — POCT URINALYSIS DIPSTICK
GLUCOSE UA: NEGATIVE
KETONES UA: NEGATIVE
Nitrite, UA: NEGATIVE
Protein, UA: NEGATIVE
RBC UA: NEGATIVE

## 2016-07-02 NOTE — Patient Instructions (Signed)

## 2016-07-02 NOTE — Progress Notes (Signed)
G1P0 [redacted]w[redacted]d Estimated Date of Delivery: 07/15/16  Blood pressure 130/80, pulse 80, weight 196 lb (88.9 kg), last menstrual period 10/01/2015.   BP weight and urine results all reviewed and noted.  Please refer to the obstetrical flow sheet for the fundal height and fetal heart rate documentation:  Patient reports good fetal movement, denies any bleeding and no rupture of membranes symptoms or regular contractions. Patient is without complaints other than normal pregnancy complatins.  Was sent to [facility] last week, GHTN ruled out. Precautions discussed  Orders Placed This Encounter  Procedures  . POCT urinalysis dipstick    Plan:  Continued routine obstetrical care,   Return in about 1 week (around 07/09/2016) for LROB.

## 2016-07-03 ENCOUNTER — Ambulatory Visit (INDEPENDENT_AMBULATORY_CARE_PROVIDER_SITE_OTHER): Payer: 59 | Admitting: Advanced Practice Midwife

## 2016-07-03 VITALS — BP 138/88 | HR 100 | Wt 197.0 lb

## 2016-07-03 DIAGNOSIS — Z3493 Encounter for supervision of normal pregnancy, unspecified, third trimester: Secondary | ICD-10-CM

## 2016-07-03 DIAGNOSIS — O368130 Decreased fetal movements, third trimester, not applicable or unspecified: Secondary | ICD-10-CM

## 2016-07-03 DIAGNOSIS — Z331 Pregnant state, incidental: Secondary | ICD-10-CM

## 2016-07-03 DIAGNOSIS — Z3483 Encounter for supervision of other normal pregnancy, third trimester: Secondary | ICD-10-CM

## 2016-07-03 DIAGNOSIS — Z1389 Encounter for screening for other disorder: Secondary | ICD-10-CM

## 2016-07-03 DIAGNOSIS — Z3A39 39 weeks gestation of pregnancy: Secondary | ICD-10-CM

## 2016-07-03 LAB — POCT URINALYSIS DIPSTICK
GLUCOSE UA: NEGATIVE
KETONES UA: NEGATIVE
Leukocytes, UA: NEGATIVE
Nitrite, UA: NEGATIVE
Protein, UA: NEGATIVE
RBC UA: 1

## 2016-07-03 NOTE — Progress Notes (Signed)
WORK IN FOR DECREASED FETAL MOVEMENT.  Also c/o brownish discharge.  Has had cx checked  Multiple times in the past few days.    NST reactive  Fetus active now  SSE:  browish dc c/w old spotting.  Otherwise normal w/o odor  Initial BP in office 160/80.  Pt has checked at home last night 120's/80's.  Recheck today 138/88.  Suspect is headed towards [place], will recheck BP in office tomorrow. Pt aware that if >140/90 will probably get IOL.  To keep eye on it at home today.

## 2016-07-04 ENCOUNTER — Ambulatory Visit (INDEPENDENT_AMBULATORY_CARE_PROVIDER_SITE_OTHER): Payer: 59 | Admitting: Obstetrics & Gynecology

## 2016-07-04 VITALS — BP 128/72 | HR 118 | Wt 197.0 lb

## 2016-07-04 DIAGNOSIS — Z3A39 39 weeks gestation of pregnancy: Secondary | ICD-10-CM | POA: Diagnosis not present

## 2016-07-04 DIAGNOSIS — Z331 Pregnant state, incidental: Secondary | ICD-10-CM

## 2016-07-04 DIAGNOSIS — O0993 Supervision of high risk pregnancy, unspecified, third trimester: Secondary | ICD-10-CM | POA: Diagnosis not present

## 2016-07-04 DIAGNOSIS — O133 Gestational [pregnancy-induced] hypertension without significant proteinuria, third trimester: Secondary | ICD-10-CM | POA: Diagnosis not present

## 2016-07-04 DIAGNOSIS — Z1389 Encounter for screening for other disorder: Secondary | ICD-10-CM

## 2016-07-04 LAB — POCT URINALYSIS DIPSTICK
Blood, UA: NEGATIVE
GLUCOSE UA: NEGATIVE
KETONES UA: NEGATIVE
LEUKOCYTES UA: NEGATIVE
Nitrite, UA: NEGATIVE
PROTEIN UA: NEGATIVE

## 2016-07-04 NOTE — Progress Notes (Signed)
Pt denies any problems or concerns at this time. Pt denies any headaches but states that she has had cloudy vision.

## 2016-07-04 NOTE — Progress Notes (Signed)
Fetal Surveillance Testing today:  Reactive NST yesterday, FHR 160 today   High Risk Pregnancy Diagnosis(es):   Gestational Hypertension, borderline  G1P0 [redacted]w[redacted]d Estimated Date of Delivery: 07/15/16  Blood pressure 128/72, pulse (!) 118, weight 197 lb (89.4 kg), last menstrual period 10/01/2015.  Urinalysis: Negative   HPI: The patient is being seen today for ongoing management of bordrline gestational hypertension. Today she reports BP at home 130/75-148/95 range, will continue to take at home 4 times daily If BP increases to .150/100 consistently will bring in for induction Scheduled for induction 38 6/7 07/07/2016 0730   BP weight and urine results all reviewed and noted. Patient reports good fetal movement, denies any bleeding and no rupture of membranes symptoms or regular contractions.  Fundal Height:  36 Fetal Heart rate:  160 Edema:  trace  Patient is without complaints other than noted in her HPI. All questions were answered.  All lab and sonogram results have been reviewed. Comments: abnormal: BP  Assessment:  1.  Pregnancy at [redacted]w[redacted]d,  Estimated Date of Delivery: 07/15/16 :                          2.  Borderline gestational hypertension, pre eclampsia work up has been negative                        3.    Medication(s) Plans:  none  Treatment Plan:   As above induction 07/07/2016 unless BP deteriorates, pt understnds  Return in about 2 weeks (around 07/17/2016) for Follow up, with Dr Elonda Husky. for appointment for high risk OB care  No orders of the defined types were placed in this encounter.  Orders Placed This Encounter  Procedures  . POCT urinalysis dipstick

## 2016-07-07 ENCOUNTER — Inpatient Hospital Stay (HOSPITAL_COMMUNITY)
Admission: RE | Admit: 2016-07-07 | Discharge: 2016-07-09 | DRG: 775 | Disposition: A | Discharge status: Home / Self Care | Payer: 59 | Source: Ambulatory Visit | Attending: Obstetrics & Gynecology | Admitting: Obstetrics & Gynecology

## 2016-07-07 ENCOUNTER — Encounter (HOSPITAL_COMMUNITY): Payer: Self-pay

## 2016-07-07 ENCOUNTER — Inpatient Hospital Stay (HOSPITAL_COMMUNITY): Payer: 59 | Admitting: Anesthesiology

## 2016-07-07 DIAGNOSIS — Z3A38 38 weeks gestation of pregnancy: Secondary | ICD-10-CM | POA: Diagnosis not present

## 2016-07-07 DIAGNOSIS — Z3490 Encounter for supervision of normal pregnancy, unspecified, unspecified trimester: Secondary | ICD-10-CM

## 2016-07-07 DIAGNOSIS — O9962 Diseases of the digestive system complicating childbirth: Secondary | ICD-10-CM | POA: Diagnosis present

## 2016-07-07 DIAGNOSIS — Z87891 Personal history of nicotine dependence: Secondary | ICD-10-CM | POA: Diagnosis not present

## 2016-07-07 DIAGNOSIS — O99824 Streptococcus B carrier state complicating childbirth: Secondary | ICD-10-CM | POA: Diagnosis present

## 2016-07-07 DIAGNOSIS — Z8249 Family history of ischemic heart disease and other diseases of the circulatory system: Secondary | ICD-10-CM | POA: Diagnosis not present

## 2016-07-07 DIAGNOSIS — Z3493 Encounter for supervision of normal pregnancy, unspecified, third trimester: Secondary | ICD-10-CM

## 2016-07-07 DIAGNOSIS — K219 Gastro-esophageal reflux disease without esophagitis: Secondary | ICD-10-CM | POA: Diagnosis present

## 2016-07-07 DIAGNOSIS — Z833 Family history of diabetes mellitus: Secondary | ICD-10-CM

## 2016-07-07 DIAGNOSIS — O134 Gestational [pregnancy-induced] hypertension without significant proteinuria, complicating childbirth: Secondary | ICD-10-CM | POA: Diagnosis present

## 2016-07-07 LAB — CBC
HCT: 35.4 % — ABNORMAL LOW (ref 36.0–46.0)
HEMATOCRIT: 34.7 % — AB (ref 36.0–46.0)
Hemoglobin: 11.7 g/dL — ABNORMAL LOW (ref 12.0–15.0)
Hemoglobin: 11.8 g/dL — ABNORMAL LOW (ref 12.0–15.0)
MCH: 29.2 pg (ref 26.0–34.0)
MCH: 29.1 pg (ref 26.0–34.0)
MCHC: 33.7 g/dL (ref 30.0–36.0)
MCHC: 33.3 g/dL (ref 30.0–36.0)
MCV: 86.5 fL (ref 78.0–100.0)
MCV: 87.4 fL (ref 78.0–100.0)
PLATELETS: 176 10*3/uL (ref 150–400)
PLATELETS: 163 10*3/uL (ref 150–400)
RBC: 4.01 MIL/uL (ref 3.87–5.11)
RBC: 4.05 MIL/uL (ref 3.87–5.11)
RDW: 14.6 % (ref 11.5–15.5)
RDW: 14.7 % (ref 11.5–15.5)
WBC: 9.4 10*3/uL (ref 4.0–10.5)
WBC: 12.2 10*3/uL — AB (ref 4.0–10.5)

## 2016-07-07 LAB — ABO/RH: ABO/RH(D): A POS

## 2016-07-07 LAB — TYPE AND SCREEN
ABO/RH(D): A POS
ANTIBODY SCREEN: NEGATIVE

## 2016-07-07 MED ORDER — OXYTOCIN 40 UNITS IN LACTATED RINGERS INFUSION - SIMPLE MED
2.5000 [IU]/h | INTRAVENOUS | Status: DC
  Filled 2016-07-07: qty 1000

## 2016-07-07 MED ORDER — LACTATED RINGERS IV SOLN: 500.0000 mL | INTRAVENOUS | Status: DC | PRN

## 2016-07-07 MED ORDER — FENTANYL CITRATE (PF) 100 MCG/2ML IJ SOLN
100.0000 ug | INTRAMUSCULAR | Status: DC | PRN
  Administered 2016-07-07: 100 ug via INTRAVENOUS
  Filled 2016-07-07: qty 2

## 2016-07-07 MED ORDER — ACETAMINOPHEN 325 MG PO TABS: 650.0000 mg | ORAL_TABLET | ORAL | Status: DC | PRN

## 2016-07-07 MED ORDER — FENTANYL 2.5 MCG/ML BUPIVACAINE 1/10 % EPIDURAL INFUSION (WH - ANES)
INTRAMUSCULAR | Status: AC
  Filled 2016-07-07: qty 125

## 2016-07-07 MED ORDER — ONDANSETRON HCL 4 MG/2ML IJ SOLN: 4.0000 mg | Freq: Four times a day (QID) | INTRAMUSCULAR | Status: DC | PRN

## 2016-07-07 MED ORDER — PENICILLIN G POTASSIUM 5000000 UNITS IJ SOLR
2.5000 10*6.[IU] | INTRAVENOUS | Status: DC
  Administered 2016-07-07 (×2): 2.5 10*6.[IU] via INTRAVENOUS
  Filled 2016-07-07 (×6): qty 2.5

## 2016-07-07 MED ORDER — TERBUTALINE SULFATE 1 MG/ML IJ SOLN: 0.2500 mg | Freq: Once | INTRAMUSCULAR | Status: DC | PRN

## 2016-07-07 MED ORDER — OXYCODONE-ACETAMINOPHEN 5-325 MG PO TABS: 1.0000 | ORAL_TABLET | ORAL | Status: DC | PRN

## 2016-07-07 MED ORDER — OXYTOCIN 40 UNITS IN LACTATED RINGERS INFUSION - SIMPLE MED: 1.0000 m[IU]/min | INTRAVENOUS | Status: DC

## 2016-07-07 MED ORDER — EPHEDRINE 5 MG/ML INJ: 10.0000 mg | INTRAVENOUS | Status: DC | PRN

## 2016-07-07 MED ORDER — DIPHENHYDRAMINE HCL 50 MG/ML IJ SOLN: 12.5000 mg | INTRAMUSCULAR | Status: DC | PRN

## 2016-07-07 MED ORDER — PHENYLEPHRINE 40 MCG/ML (10ML) SYRINGE FOR IV PUSH (FOR BLOOD PRESSURE SUPPORT)
PREFILLED_SYRINGE | INTRAVENOUS | Status: AC
  Filled 2016-07-07: qty 20

## 2016-07-07 MED ORDER — MISOPROSTOL 25 MCG QUARTER TABLET
25.0000 ug | ORAL_TABLET | ORAL | Status: DC
  Filled 2016-07-07: qty 0.25

## 2016-07-07 MED ORDER — LACTATED RINGERS IV SOLN
INTRAVENOUS | Status: DC
  Administered 2016-07-07 (×2): via INTRAVENOUS
  Administered 2016-07-07: 125 mL/h via INTRAVENOUS

## 2016-07-07 MED ORDER — LACTATED RINGERS IV SOLN
500.0000 mL | Freq: Once | INTRAVENOUS | Status: AC
  Administered 2016-07-07: 500 mL via INTRAVENOUS

## 2016-07-07 MED ORDER — LIDOCAINE HCL (PF) 1 % IJ SOLN
30.0000 mL | INTRAMUSCULAR | Status: DC | PRN
  Filled 2016-07-07: qty 30

## 2016-07-07 MED ORDER — FENTANYL 2.5 MCG/ML BUPIVACAINE 1/10 % EPIDURAL INFUSION (WH - ANES)
14.0000 mL/h | INTRAMUSCULAR | Status: DC | PRN
  Administered 2016-07-07 (×2): 14 mL/h via EPIDURAL

## 2016-07-07 MED ORDER — MISOPROSTOL 25 MCG QUARTER TABLET
25.0000 ug | ORAL_TABLET | ORAL | Status: DC
  Administered 2016-07-07: 25 ug via VAGINAL
  Filled 2016-07-07: qty 0.25

## 2016-07-07 MED ORDER — PHENYLEPHRINE 40 MCG/ML (10ML) SYRINGE FOR IV PUSH (FOR BLOOD PRESSURE SUPPORT)
80.0000 ug | PREFILLED_SYRINGE | INTRAVENOUS | Status: DC | PRN
Start: 2016-07-07 — End: 2016-07-08

## 2016-07-07 MED ORDER — SOD CITRATE-CITRIC ACID 500-334 MG/5ML PO SOLN
30.0000 mL | ORAL | Status: DC | PRN
  Administered 2016-07-07: 30 mL via ORAL
  Filled 2016-07-07: qty 15

## 2016-07-07 MED ORDER — PENICILLIN G POTASSIUM 5000000 UNITS IJ SOLR
5.0000 10*6.[IU] | Freq: Once | INTRAVENOUS | Status: AC
  Administered 2016-07-07: 5 10*6.[IU] via INTRAVENOUS
  Filled 2016-07-07: qty 5

## 2016-07-07 MED ORDER — PHENYLEPHRINE 40 MCG/ML (10ML) SYRINGE FOR IV PUSH (FOR BLOOD PRESSURE SUPPORT): 80.0000 ug | PREFILLED_SYRINGE | INTRAVENOUS | Status: DC | PRN

## 2016-07-07 MED ORDER — OXYCODONE-ACETAMINOPHEN 5-325 MG PO TABS: 2.0000 | ORAL_TABLET | ORAL | Status: DC | PRN

## 2016-07-07 MED ORDER — OXYTOCIN BOLUS FROM INFUSION
500.0000 mL | Freq: Once | INTRAVENOUS | Status: DC
  Administered 2016-07-08: 500 mL via INTRAVENOUS

## 2016-07-07 MED ORDER — LIDOCAINE HCL (PF) 1 % IJ SOLN
INTRAMUSCULAR | Status: DC | PRN
  Administered 2016-07-07 (×2): 7 mL via EPIDURAL

## 2016-07-07 NOTE — Anesthesia Preprocedure Evaluation (Signed)
Anesthesia Evaluation  Patient identified by MRN, date of birth, ID band Patient awake    Reviewed: Allergy & Precautions, H&P , NPO status , Patient's Chart, lab work & pertinent test results  Airway Mallampati: II  TM Distance: >3 FB Neck ROM: full    Dental no notable dental hx.    Pulmonary neg pulmonary ROS, former smoker,    Pulmonary exam normal        Cardiovascular negative cardio ROS Normal cardiovascular exam     Neuro/Psych negative neurological ROS  negative psych ROS   GI/Hepatic negative GI ROS, Neg liver ROS,   Endo/Other  negative endocrine ROS  Renal/GU negative Renal ROS     Musculoskeletal   Abdominal (+) + obese,   Peds  Hematology negative hematology ROS (+)   Anesthesia Other Findings   Reproductive/Obstetrics (+) Pregnancy                             Anesthesia Physical Anesthesia Plan  ASA: II  Anesthesia Plan: Epidural   Post-op Pain Management:    Induction:   Airway Management Planned:   Additional Equipment:   Intra-op Plan:   Post-operative Plan:   Informed Consent: I have reviewed the patients History and Physical, chart, labs and discussed the procedure including the risks, benefits and alternatives for the proposed anesthesia with the patient or authorized representative who has indicated his/her understanding and acceptance.     Plan Discussed with:   Anesthesia Plan Comments:         Anesthesia Quick Evaluation

## 2016-07-07 NOTE — Anesthesia Pain Management Evaluation Note (Signed)
  CRNA Pain Management Visit Note  Patient: Sydney Trujillo, 25 y.o., female  "Hello I am a member of the anesthesia team at [facility]. We have an anesthesia team available at all times to provide care throughout the hospital, including epidural management and anesthesia for C-section. I don't know your plan for the delivery whether it a natural birth, water birth, IV sedation, nitrous supplementation, doula or epidural, but we want to meet your pain goals."   1.Was your pain managed to your expectations on prior hospitalizations?   {CHL AN YES/NO/ASLEEP:[id number]  2.What is your expectation for pain management during this hospitalization?     Epidural  3.How can we help you reach that goal? IV medication, nursing support and epidural  Record the patient's initial score and the patient's pain goal.   Pain: 2  Pain Goal: 6 [facility] wants you to be able to say your pain was always managed very well.  [facility] 07/07/2016

## 2016-07-07 NOTE — Anesthesia Procedure Notes (Signed)
Epidural Patient location during procedure: OB Start time: 07/07/2016 6:44 PM End time: 07/07/2016 6:48 PM  Staffing Anesthesiologist: Lyn Hollingshead Performed: anesthesiologist   Preanesthetic Checklist Completed: patient identified, surgical consent, pre-op evaluation, timeout performed, IV checked, risks and benefits discussed and monitors and equipment checked  Epidural Patient position: sitting Prep: site prepped and draped and DuraPrep Patient monitoring: continuous pulse ox and blood pressure Approach: midline Location: L3-L4 Injection technique: LOR air  Needle:  Needle type: Tuohy  Needle gauge: 17 G Needle length: 9 cm and 9 Needle insertion depth: 5 cm cm Catheter type: closed end flexible Catheter size: 19 Gauge Catheter at skin depth: 10 cm Test dose: negative and Other  Assessment Sensory level: T9 Events: blood not aspirated, injection not painful, no injection resistance, negative IV test and no paresthesia  Additional Notes Reason for block:procedure for pain

## 2016-07-07 NOTE — H&P (Signed)
LABOR AND DELIVERY ADMISSION HISTORY AND PHYSICAL NOTE  Sydney Trujillo is a 25 y.o. female G1P0 with IUP at [redacted]w[redacted]d by 8 wk [place] presenting for IOL for GHTN.   She reports positive fetal movement. She denies leakage of fluid or vaginal bleeding.  Prenatal History/Complications:  Past Medical History: Past Medical History:  Diagnosis Date  . Abdominal pain 09/07/2013  . Constipation 11/08/2015  . Irregular bleeding 08/30/2013  . IUD (intrauterine device) in place 10/18/2013   07/2009 IUD  . Pregnant 11/08/2015  . Reflux 11/08/2015    Past Surgical History: Past Surgical History:  Procedure Laterality Date  . BREAST BIOPSY  10/17/2011   Procedure: BREAST BIOPSY;  Surgeon: Donato Heinz;  Location: [facility];  Service: General;  Laterality: Left;    Obstetrical History: OB History    Gravida Para Term Preterm AB Living   1             SAB TAB Ectopic Multiple Live Births                  Social History: Social History   Social History  . Marital status: Married    Spouse name: N/A  . Number of children: N/A  . Years of education: N/A   Social History Main Topics  . Smoking status: Former Smoker    Packs/day: 0.50    Years: 1.00    Types: Cigarettes    Quit date: 10/12/2009  . Smokeless tobacco: Never Used  . Alcohol use No  . Drug use: No  . Sexual activity: Yes    Birth control/ protection: None   Other Topics Concern  . None   Social History Narrative  . None    Family History: Family History  Problem Relation Age of Onset  . Hypertension Mother   . Diabetes Father   . Hypertension Father   . Diabetes Paternal Grandmother   . Hypertension Paternal Grandmother   . Fibromyalgia Paternal Grandmother   . Anesthesia problems Neg Hx   . Hypotension Neg Hx   . Malignant hyperthermia Neg Hx   . Pseudochol deficiency Neg Hx     Allergies: Allergies  Allergen Reactions  . Latex Itching    Prescriptions Prior to Admission  Medication Sig Dispense Refill  Last Dose  . amoxicillin (AMOXIL) 500 MG capsule Take 1 capsule (500 mg total) by mouth 3 (three) times daily. 21 capsule 0 Taking  . loratadine (CLARITIN) 10 MG tablet Take 10 mg by mouth daily.   Taking  . Prenatal Vit-Fe Fumarate-FA (MULTIVITAMIN-PRENATAL) 27-0.8 MG TABS tablet Take 1 tablet by mouth daily at 12 noon.   Taking  . ranitidine (ZANTAC) 75 MG tablet Take 75 mg by mouth 2 (two) times daily.   Taking     Review of Systems   All systems reviewed and negative except as stated in HPI  Blood pressure (!) 140/95, pulse (!) 118, temperature 97.8 F (36.6 C), temperature source Oral, resp. rate 18, height 5\' 2"  (1.575 m), weight 89.4 kg (197 lb), last menstrual period 10/01/2015. General appearance: alert, cooperative and no distress Lungs: clear to auscultation bilaterally Heart: regular rate and rhythm Abdomen: soft, non-tender; bowel sounds normal Extremities: No calf swelling or tenderness Presentation: cephalic Fetal monitoring: cat 1 Uterine activity: irregular     Prenatal labs: ABO, Rh: A/Positive/-- (01/19 0920) Antibody: Negative (05/18 0848) Rubella: !Error! RPR: Non Reactive (05/18 0848)  HBsAg: Negative (01/19 0920)  HIV: Non Reactive (05/18 0848)  GBS: Positive (  07/26 1200)  1 hr Glucola: 2 hr normal Genetic screening:  NTIT neg, CF dec Anatomy US: normal female  Prenatal Transfer Tool  Maternal Diabetes: No Genetic Screening: Normal Maternal Ultrasounds/Referrals: Normal Fetal Ultrasounds or other Referrals:  None Maternal Substance Abuse:  No Significant Maternal Medications:  None Significant Maternal Lab Results: Lab values include: Group B Strep positive  No results found for this or any previous visit (from the past 24 hour(s)).  Patient Active Problem List   Diagnosis Date Noted  . Atypical squamous cells of undetermined significance (ASCUS) on Papanicolaou smear of cervix 02/07/2016  . Supervision of normal pregnancy 11/08/2015  .  Constipation 11/08/2015  . Reflux 11/08/2015  . Abdominal pain 09/07/2013  . Irregular bleeding 08/30/2013    Assessment: Sydney Trujillo is a 25 y.o. G1P0 at [redacted]w[redacted]d here for IOL or GHTN.  #Labor:cytotec, will eval for foley next check #Pain: Desires epidural when ready #FWB: Cat 1 #ID:  GBS pos > PCN once in active labor #MOF: breast #MOC: paraguard, pt not completely sure  [name] 07/07/2016, 9:07 AM   OB FELLOW HISTORY AND PHYSICAL ATTESTATION  I have seen and examined this patient; I agree with above documentation in the resident's note.    Sydney Trujillo 07/07/2016, 6:15 PM

## 2016-07-08 ENCOUNTER — Encounter (HOSPITAL_COMMUNITY): Payer: Self-pay

## 2016-07-08 DIAGNOSIS — Z3A38 38 weeks gestation of pregnancy: Secondary | ICD-10-CM

## 2016-07-08 DIAGNOSIS — O134 Gestational [pregnancy-induced] hypertension without significant proteinuria, complicating childbirth: Secondary | ICD-10-CM

## 2016-07-08 DIAGNOSIS — O9962 Diseases of the digestive system complicating childbirth: Secondary | ICD-10-CM

## 2016-07-08 DIAGNOSIS — Z87891 Personal history of nicotine dependence: Secondary | ICD-10-CM

## 2016-07-08 DIAGNOSIS — O99824 Streptococcus B carrier state complicating childbirth: Secondary | ICD-10-CM

## 2016-07-08 LAB — RPR: RPR: NONREACTIVE

## 2016-07-08 MED ORDER — SOD CITRATE-CITRIC ACID 500-334 MG/5ML PO SOLN
ORAL | Status: AC
  Filled 2016-07-08: qty 15

## 2016-07-08 MED ORDER — WITCH HAZEL-GLYCERIN EX PADS
1.0000 "application " | MEDICATED_PAD | CUTANEOUS | Status: DC | PRN
  Administered 2016-07-08: 1 via TOPICAL

## 2016-07-08 MED ORDER — DIBUCAINE 1 % RE OINT
1.0000 "application " | TOPICAL_OINTMENT | RECTAL | Status: DC | PRN
  Administered 2016-07-08: 1 via RECTAL
  Filled 2016-07-08: qty 28.4
  Filled 2016-07-08: qty 28

## 2016-07-08 MED ORDER — ZOLPIDEM TARTRATE 5 MG PO TABS: 5.0000 mg | ORAL_TABLET | Freq: Every evening | ORAL | Status: DC | PRN

## 2016-07-08 MED ORDER — COCONUT OIL OIL
1.0000 "application " | TOPICAL_OIL | Status: DC | PRN
  Filled 2016-07-08: qty 120

## 2016-07-08 MED ORDER — TETANUS-DIPHTH-ACELL PERTUSSIS 5-2.5-18.5 LF-MCG/0.5 IM SUSP
0.5000 mL | Freq: Once | INTRAMUSCULAR | Status: AC
  Administered 2016-07-09: 0.5 mL via INTRAMUSCULAR
  Filled 2016-07-08: qty 0.5

## 2016-07-08 MED ORDER — ONDANSETRON HCL 4 MG PO TABS: 4.0000 mg | ORAL_TABLET | ORAL | Status: DC | PRN

## 2016-07-08 MED ORDER — PRENATAL MULTIVITAMIN CH
1.0000 | ORAL_TABLET | Freq: Every day | ORAL | Status: DC
  Administered 2016-07-08: 1 via ORAL
  Filled 2016-07-08: qty 1

## 2016-07-08 MED ORDER — SENNOSIDES-DOCUSATE SODIUM 8.6-50 MG PO TABS
2.0000 | ORAL_TABLET | ORAL | Status: DC
  Administered 2016-07-08: 2 via ORAL
  Filled 2016-07-08: qty 2

## 2016-07-08 MED ORDER — SOD CITRATE-CITRIC ACID 500-334 MG/5ML PO SOLN
30.0000 mL | Freq: Once | ORAL | Status: AC
  Administered 2016-07-08: 30 mL via ORAL

## 2016-07-08 MED ORDER — DIPHENHYDRAMINE HCL 25 MG PO CAPS: 25.0000 mg | ORAL_CAPSULE | Freq: Four times a day (QID) | ORAL | Status: DC | PRN

## 2016-07-08 MED ORDER — IBUPROFEN 600 MG PO TABS
600.0000 mg | ORAL_TABLET | Freq: Four times a day (QID) | ORAL | Status: DC
  Administered 2016-07-08 – 2016-07-09 (×5): 600 mg via ORAL
  Filled 2016-07-08 (×5): qty 1

## 2016-07-08 MED ORDER — BENZOCAINE-MENTHOL 20-0.5 % EX AERO
1.0000 "application " | INHALATION_SPRAY | CUTANEOUS | Status: DC | PRN
  Administered 2016-07-08: 1 via TOPICAL
  Filled 2016-07-08 (×2): qty 56

## 2016-07-08 MED ORDER — ACETAMINOPHEN 325 MG PO TABS: 650.0000 mg | ORAL_TABLET | ORAL | Status: DC | PRN

## 2016-07-08 MED ORDER — SIMETHICONE 80 MG PO CHEW
80.0000 mg | CHEWABLE_TABLET | ORAL | Status: DC | PRN
  Filled 2016-07-08: qty 1

## 2016-07-08 MED ORDER — ONDANSETRON HCL 4 MG/2ML IJ SOLN: 4.0000 mg | INTRAMUSCULAR | Status: DC | PRN

## 2016-07-08 NOTE — Anesthesia Postprocedure Evaluation (Signed)
Anesthesia Post Note  Patient: Sydney Trujillo  Procedure(s) Performed: * No procedures listed *  Patient location during evaluation: Mother Baby Anesthesia Type: Epidural Level of consciousness: awake and alert Pain management: pain level controlled Vital Signs Assessment: post-procedure vital signs reviewed and stable Respiratory status: spontaneous breathing, nonlabored ventilation and respiratory function stable Cardiovascular status: stable Postop Assessment: no headache, no backache and epidural receding Anesthetic complications: no     Last Vitals:  Vitals:   07/08/16 0325 07/08/16 0535  BP: 126/67 126/73  Pulse: 98 98  Resp: 18 20  Temp: 37.3 C 37 C    Last Pain:  Vitals:   07/08/16 0538  TempSrc:   PainSc: 1    Pain Goal:                 Riki Sheer

## 2016-07-08 NOTE — Progress Notes (Signed)
Sydney Trujillo is a 25 y.o. G1P0 at [redacted]w[redacted]d by 8wk [place]  admitted for IOL for GHTN  Subjective: Sydney Trujillo is progressing in labor as expected. She currently has some nausea and is "laboring down".  Objective: BP (!) 106/55   Pulse 86   Temp 98.1 F (36.7 C) (Oral)   Resp 20   Ht 5\' 2"  (1.575 m)   Wt 197 lb (89.4 kg)   LMP 10/01/2015 (Exact Date)   SpO2 99%   BMI 36.03 kg/m  No intake/output data recorded. Total I/O In: -  Out: 850 [Urine:850]  Fetal Heart Rate A  Mode External filed at 07/07/2016 1853  Baseline Rate (A) 125 bpm filed at 07/07/2016 2312  Variability 6-25 BPM filed at 07/07/2016 2312  Accelerations 15 x 15 filed at 07/07/2016 2312  Decelerations Variable filed at 07/07/2016 2312  Scalp Stimulation Positive filed at 07/07/2016 1950     UC:   Regular ctx, 3-5 mins, 40-90 sec each SVE:   Dilation: 10 Effacement (%): 100 Station: 0 Exam by:: L. Cresenzo, RNC  Labs: Lab Results  Component Value Date   WBC 12.2 (H) 07/07/2016   HGB 11.8 (L) 07/07/2016   HCT 35.4 (L) 07/07/2016   MCV 87.4 07/07/2016   PLT 163 07/07/2016    Assessment / Plan: IOL for gHTN progressing as planned  Labor: Progressing normally Fetal Wellbeing:  Category I Pain Control:  Epidural I/D:  GBS pos> PCN Anticipated MOD:  NSVD   Casimer Leek, MD, PGY-1, MPH 07/08/2016, 12:03 AM

## 2016-07-08 NOTE — Lactation Note (Signed)
This note was copied from a baby's chart. Lactation Consultation Note  P1, Baby 25 hours old. Mother has biopsy during on upper part of L breast and also approx 3.5 year ago had a large cyst removed from lower part of L breast.  Incision around areola from 3 o'clock to 9 o'clock. Baby latched in cross cradle on boppy upon entering. After approx 20 min baby became sleepy. Demonstrated how to burp baby and baby began to spit up. Reviewed how to prepump if difficult to latch w/ manual pump. Attempted to relatch in cross cradle and football hold on R side. Baby too sleepy to latch. Encouraged STS.  Mom encouraged to feed baby 8-12 times/24 hours and with feeding cues.  Mom made aware of O/P services, breastfeeding support groups, community resources, and our phone # for post-discharge questions.    Patient Name: Girl Harlequinn Kovalski [id number] Date: 07/08/2016 Reason for consult: Initial assessment   Maternal Data Has patient been taught Hand Expression?: Yes Does the patient have breastfeeding experience prior to this delivery?: No  Feeding Feeding Type: Breast Fed Length of feed: 25 min  LATCH Score/Interventions Latch: Grasps breast easily, tongue down, lips flanged, rhythmical sucking. Intervention(s): Adjust position;Assist with latch;Breast massage;Breast compression  Audible Swallowing: A few with stimulation Intervention(s): Skin to skin;Hand expression Intervention(s): Skin to skin;Hand expression  Type of Nipple: Everted at rest and after stimulation  Comfort (Breast/Nipple): Soft / non-tender     Hold (Positioning): Assistance needed to correctly position infant at breast and maintain latch. Intervention(s): Breastfeeding basics reviewed;Support Pillows;Position options;Skin to skin  LATCH Score: 8  Lactation Tools Discussed/Used     Consult Status Consult Status: Follow-up Date: 07/08/16 Follow-up type: In-patient    Vivianne Master [facility] 07/08/2016, 2:36  PM

## 2016-07-09 ENCOUNTER — Encounter: Payer: 59 | Admitting: Obstetrics & Gynecology

## 2016-07-09 MED ORDER — IBUPROFEN 600 MG PO TABS: 600.0000 mg | ORAL_TABLET | Freq: Four times a day (QID) | ORAL | 0 refills | Status: DC

## 2016-07-09 NOTE — Discharge Instructions (Signed)

## 2016-07-09 NOTE — Lactation Note (Addendum)
This note was copied from a baby's chart. Lactation Consultation Note  Patient Name: Sydney Trujillo [id number] Date: 07/09/2016 Reason for consult: Follow-up assessment;Difficult latch  Baby 48 hours old. Parents report some difficulty with getting baby latched, but state that once baby latched, she nurses well. Mom attempted to latch baby in cross-cradle position and baby latched briefly but would not suckle. Baby instead bearing down and passing gas. Enc parents to call this [place] when baby ready to nurse. This LC's extension placed on board in room. Mom reports that surgical scar on upper right breast was fairly deep. Discussed with mom how surgical incision can impact breast milk production and volume.  Parents called back to assess a latch. Baby would not latch to left breast. However, baby did latch deeply to right breast in cross-cradle position, and nursed off-and-on for 20 minutes. Baby fussy at breast, and mom not able to easily express colostrum, so set mom up with DEBP. Mom's colostrum did begin to flow. Enc mom to offer breast first with cues, then supplement with EBM, and then post pump until baby nursing well at breast and satisfied. Parents given supplementation guidelines, and discussed the need to supplement with formula if EBM amounts not increasing. Mom has DEBP at home. Assessment and interventions discussed with patient's bedside nurse, Maudie Mercury, RN.  Maternal Data    Feeding Feeding Type: Breast Fed Length of feed: 0 min  LATCH Score/Interventions Latch: Too sleepy or reluctant, no latch achieved, no sucking elicited. Intervention(s): Skin to skin;Waking techniques Intervention(s): Adjust position;Assist with latch;Breast compression  Audible Swallowing: None  Type of Nipple: Everted at rest and after stimulation  Comfort (Breast/Nipple): Soft / non-tender     Hold (Positioning): Assistance needed to correctly position infant at breast and maintain latch. Intervention(s):  Breastfeeding basics reviewed  LATCH Score: 5  Lactation Tools Discussed/Used     Consult Status Consult Status: Follow-up    [name] 07/09/2016, 10:04 AM

## 2016-07-09 NOTE — Lactation Note (Addendum)
This note was copied from a baby's chart. Lactation Consultation Note  Patient Name: Sydney Trujillo [id number] Date: 07/09/2016 Reason for consult: Follow-up assessment  Baby 63 hours old. Assisted parents with syringe and finger feeding 3 ml of EBM, and baby tolerated well. Reiterated to parents the need to give formula if EBM amounts not increasing. Mom began pumping again right after the baby was supplemented, and milk flowing well from right breast. Left breast not flowing as well, and mom understands to continue stimulating left breast to enc increase in breast milk supply but that she may have less milk d/t breast surgeries. Referred parents to Baby and Me booklet for EBM storage guidelines and number of diapers to expect by day of life.   Maternal Data    Feeding Feeding Type: Breast Milk Length of feed: 0 min  LATCH Score/Interventions Latch: Too sleepy or reluctant, no latch achieved, no sucking elicited. Intervention(s): Skin to skin;Waking techniques Intervention(s): Adjust position;Assist with latch;Breast compression  Audible Swallowing: None  Type of Nipple: Everted at rest and after stimulation  Comfort (Breast/Nipple): Soft / non-tender     Hold (Positioning): Assistance needed to correctly position infant at breast and maintain latch. Intervention(s): Breastfeeding basics reviewed  LATCH Score: 5  Lactation Tools Discussed/Used     Consult Status Consult Status: Follow-up    [name] 07/09/2016, 12:09 PM

## 2016-07-09 NOTE — Discharge Summary (Signed)
OB Discharge Summary     Patient Name: Sydney Trujillo DOB: [birth year] MRN: [id number]  Date of admission: 07/07/2016 Delivering MD: Casimer Leek R   Date of discharge: 07/09/2016  Admitting diagnosis: 38wks, induction Intrauterine pregnancy: [redacted]w[redacted]d     Secondary diagnosis:  Active Problems:   Supervision of normal pregnancy   Normal delivery  Additional problems: None     Discharge diagnosis: Term Pregnancy Delivered                                                                                                Post partum procedures:n/a  Augmentation: Pitocin and Cytotec  Complications: None  Hospital course:  Induction of Labor With Vaginal Delivery   25 y.o. yo G1P1001 at [redacted]w[redacted]d was admitted to the hospital 07/07/2016 for induction of labor.  Indication for induction: Gestational hypertension.  Patient had an uncomplicated labor course as follows: Membrane Rupture Time/Date: 7:50 PM ,07/07/2016   Intrapartum Procedures: Episiotomy: None [1]                                         Lacerations:  1st degree [2];Labial [10]  Patient had delivery of a Viable infant.  Information for the patient's newborn:  Sandee, Portes [id number]  Delivery Method: Vaginal, Spontaneous Delivery (Filed from Delivery Summary)   07/08/2016  Details of delivery can be found in separate delivery note.  Patient had a routine postpartum course. Patient is discharged home 07/09/16.   Physical exam Vitals:   07/08/16 0325 07/08/16 0535 07/08/16 0930 07/08/16 1728  BP: 126/67 126/73 117/70 (!) 142/66  Pulse: 98 98 88 100  Resp: 18 20 16 16   Temp: 99.1 F (37.3 C) 98.6 F (37 C) 98.6 F (37 C) 98.2 F (36.8 C)  TempSrc: Oral Oral Oral Oral  SpO2: 95% 95%  98%  Weight:      Height:       General: alert, cooperative and no distress Lochia: appropriate Uterine Fundus: firm Incision: N/A DVT Evaluation: No evidence of DVT seen on physical exam. Labs: Lab Results  Component Value Date   WBC  12.2 (H) 07/07/2016   HGB 11.8 (L) 07/07/2016   HCT 35.4 (L) 07/07/2016   MCV 87.4 07/07/2016   PLT 163 07/07/2016   CMP Latest Ref Rng & Units 06/30/2016  Glucose 65 - 99 mg/dL 98  BUN 6 - 20 mg/dL 7  Creatinine 0.44 - 1.00 mg/dL 0.55  Sodium 135 - 145 mmol/L 134(L)  Potassium 3.5 - 5.1 mmol/L 3.5  Chloride 101 - 111 mmol/L 108  CO2 22 - 32 mmol/L 19(L)  Calcium 8.9 - 10.3 mg/dL 9.3  Total Protein 6.5 - 8.1 g/dL 6.1(L)  Total Bilirubin 0.3 - 1.2 mg/dL 0.5  Alkaline Phos 38 - 126 U/L 126  AST 15 - 41 U/L 21  ALT 14 - 54 U/L 16    Discharge instruction: per After Visit Summary and "Baby and Me Booklet".  After visit meds:    Medication List  TAKE these medications   ibuprofen 600 MG tablet Commonly known as:  ADVIL,MOTRIN Take 1 tablet (600 mg total) by mouth every 6 (six) hours.   loratadine 10 MG tablet Commonly known as:  CLARITIN Take 10 mg by mouth daily.   multivitamin-prenatal 27-0.8 MG Tabs tablet Take 1 tablet by mouth daily at 12 noon.   ranitidine 150 MG tablet Commonly known as:  ZANTAC Take 150 mg by mouth 2 (two) times daily.       Diet: routine diet  Activity: Advance as tolerated. Pelvic rest for 6 weeks.   Outpatient follow up:6 weeks Follow up Appt:No future appointments. Follow up Visit:No Follow-up on file.  Postpartum contraception: IUD Paragard  Newborn Data: Live born female  Birth Weight: 8 lb 0.8 oz (3650 g) APGAR: 8, 9  Baby Feeding: Breast Disposition:home with mother   07/09/2016 LEFTWICH-KIRBY, Lattie Haw, CNM

## 2016-07-18 ENCOUNTER — Ambulatory Visit: Payer: 59 | Admitting: Obstetrics & Gynecology

## 2016-08-12 ENCOUNTER — Encounter: Payer: Self-pay | Admitting: Advanced Practice Midwife

## 2016-08-12 ENCOUNTER — Ambulatory Visit (INDEPENDENT_AMBULATORY_CARE_PROVIDER_SITE_OTHER): Payer: 59 | Admitting: Advanced Practice Midwife

## 2016-08-12 NOTE — Progress Notes (Signed)
Sydney Trujillo is a 25 y.o. who presents for a postpartum visit. She is 4 weeks postpartum following a spontaneous vaginal delivery. I have fully reviewed the prenatal and intrapartum course. She had IOL for GHTN. The delivery was at 58 gestational weeks.  Anesthesia: epidural. Postpartum course has been unevnetful. Baby's course has been uneventful. Baby is feeding by bottle (breastfed until 3-4 days ago). Bleeding: staining only. Bowel function is normal. Bladder function is normal. Patient is not sexually active. Contraception method is none. Postpartum depression screening: negative.   Current Outpatient Prescriptions:  .  ibuprofen (ADVIL,MOTRIN) 600 MG tablet, Take 1 tablet (600 mg total) by mouth every 6 (six) hours., Disp: 30 tablet, Rfl: 0 .  loratadine (CLARITIN) 10 MG tablet, Take 10 mg by mouth daily., Disp: , Rfl:  .  Prenatal Vit-Fe Fumarate-FA (MULTIVITAMIN-PRENATAL) 27-0.8 MG TABS tablet, Take 1 tablet by mouth daily at 12 noon., Disp: , Rfl:  .  ranitidine (ZANTAC) 150 MG tablet, Take 150 mg by mouth 2 (two) times daily., Disp: , Rfl:   Review of Systems   Constitutional: Negative for fever and chills Eyes: Negative for visual disturbances Respiratory: Negative for shortness of breath, dyspnea Cardiovascular: Negative for chest pain or palpitations  Gastrointestinal: Negative for vomiting, diarrhea and constipation Genitourinary: Negative for dysuria and urgency Musculoskeletal: Negative for back pain, joint pain, myalgias  Neurological: Negative for dizziness and headaches   Objective:     Vitals:   08/12/16 1338  BP: 118/86  Pulse: 60   General:  alert, cooperative and no distress   Breasts:  negative  Lungs: clear to auscultation bilaterally  Heart:  regular rate and rhythm  Abdomen: Soft, nontender   Vulva:  normal  Vagina: normal vagina  Cervix:  closed  Corpus: Well involuted     Rectal Exam: no hemorrhoids        Assessment:    normal postpartum  exam.  Plan:    1. Contraception: IUD 2. Follow up in: 2 weeks for paragard or as needed.

## 2016-08-25 ENCOUNTER — Ambulatory Visit (INDEPENDENT_AMBULATORY_CARE_PROVIDER_SITE_OTHER): Payer: 59 | Admitting: Women's Health

## 2016-08-25 ENCOUNTER — Encounter: Payer: Self-pay | Admitting: Women's Health

## 2016-08-25 VITALS — BP 96/70 | HR 84 | Ht 62.0 in | Wt 170.0 lb

## 2016-08-25 DIAGNOSIS — Z30430 Encounter for insertion of intrauterine contraceptive device: Secondary | ICD-10-CM | POA: Diagnosis not present

## 2016-08-25 DIAGNOSIS — Z3202 Encounter for pregnancy test, result negative: Secondary | ICD-10-CM | POA: Diagnosis not present

## 2016-08-25 DIAGNOSIS — Z30014 Encounter for initial prescription of intrauterine contraceptive device: Secondary | ICD-10-CM | POA: Diagnosis not present

## 2016-08-25 LAB — POCT URINE PREGNANCY: Preg Test, Ur: NEGATIVE

## 2016-08-25 NOTE — Progress Notes (Signed)
Sydney Trujillo is a 25 y.o. year old G36P1001 Caucasian female who presents for placement of a Paragard IUD. She is 7wks pp SVB.   No LMP recorded. BP 96/70   Pulse 84   Ht 5\' 2"  (1.575 m)   Wt 170 lb (77.1 kg)   BMI 31.09 kg/m  Last sexual intercourse was prior to birth of baby, and pregnancy test today was neg  The risks and benefits of the method and placement have been thouroughly reviewed with the patient and all questions were answered.  Specifically the patient is aware of failure rate of 12/998, expulsion of the IUD and of possible perforation.  The patient is aware of irregular bleeding due to the method and understands the incidence of irregular bleeding diminishes with time.  Signed copy of informed consent in chart.   Time out was performed.  A graves speculum was placed in the vagina.  The cervix was visualized, prepped using Betadine, and grasped with a single tooth tenaculum. The uterus was found to be anteroflexed and it sounded to 8 cm.  Paragard IUD placed per manufacturer's recommendations.   The strings were trimmed to 3 cm.  Sonogram was performed and the proper placement of the IUD was verified via transvaginal u/s.   The patient was given post procedure instructions, including signs and symptoms of infection and to check for the strings after each menses or each month, and refraining from intercourse or anything in the vagina for 3 days.  She was given a Paragard care card with date IUD placed, and date IUD to be removed.  She is scheduled for a f/u appointment in 4 weeks.  Tawnya Crook CNM, [facility] 08/25/2016 4:21 PM

## 2016-08-25 NOTE — Patient Instructions (Signed)
Nothing in vagina for 3 days (no sex, douching, tampons, etc...)  Check your strings once a month to make sure you can feel them, if you are not able to please let us know  If you develop a fever of 100.4 or more in the next few weeks, or if you develop severe abdominal pain, please let [place] know  Use a backup method of birth control, such as condoms, for 2 weeks   Intrauterine Device Insertion, Care After Refer to this sheet in the next few weeks. These instructions provide you with information on caring for yourself after your procedure. Your health care provider may also give you more specific instructions. Your treatment has been planned according to current medical practices, but problems sometimes occur. Call your health care provider if you have any problems or questions after your procedure. WHAT TO EXPECT AFTER THE PROCEDURE Insertion of the IUD may cause some discomfort, such as cramping. The cramping should improve after the IUD is in place. You may have bleeding after the procedure. This is normal. It varies from light spotting for a few days to menstrual-like bleeding. When the IUD is in place, a string will extend past the cervix into the vagina for 1-2 inches. The strings should not bother you or your partner. If they do, talk to your health care provider.  HOME CARE INSTRUCTIONS   Check your intrauterine device (IUD) to make sure it is in place before you resume sexual activity. You should be able to feel the strings. If you cannot feel the strings, something may be wrong. The IUD may have fallen out of the uterus, or the uterus may have been punctured (perforated) during placement. Also, if the strings are getting longer, it may mean that the IUD is being forced out of the uterus. You no longer have full protection from pregnancy if any of these problems occur.  You may resume sexual intercourse if you are not having problems with the IUD. The copper IUD is considered immediately  effective, and the hormone IUD works right away if inserted within 7 days of your period starting. You will need to use a backup method of birth control for 7 days if the IUD in inserted at any other time in your cycle.  Continue to check that the IUD is still in place by feeling for the strings after every menstrual period.  You may need to take pain medicine such as acetaminophen or ibuprofen. Only take medicines as directed by your health care provider. SEEK MEDICAL CARE IF:   You have bleeding that is heavier or lasts longer than a normal menstrual cycle.  You have a fever.  You have increasing cramps or abdominal pain not relieved with medicine.  You have abdominal pain that does not seem to be related to the same area of earlier cramping and pain.  You are lightheaded, unusually weak, or faint.  You have abnormal vaginal discharge or smells.  You have pain during sexual intercourse.  You cannot feel the IUD strings, or the IUD string has gotten longer.  You feel the IUD at the opening of the cervix in the vagina.  You think you are pregnant, or you miss your menstrual period.  The IUD string is hurting your sex partner. MAKE SURE YOU:  Understand these instructions.  Will watch your condition.  Will get help right away if you are not doing well or get worse.   This information is not intended to replace advice  given to you by your health care provider. Make sure you discuss any questions you have with your health care provider.   Document Released: 07/16/2011 Document Revised: 09/07/2013 Document Reviewed: 05/08/2013 Elsevier Interactive Patient Education 2016 [employer].  Intrauterine Device Information An intrauterine device (IUD) is inserted into your uterus to prevent pregnancy. There are two types of IUDs available:   Copper IUD--This type of IUD is wrapped in copper wire and is placed inside the uterus. Copper makes the uterus and fallopian tubes produce a  fluid that kills sperm. The copper IUD can stay in place for 10 years.  Hormone IUD--This type of IUD contains the hormone progestin (synthetic progesterone). The hormone thickens the cervical mucus and prevents sperm from entering the uterus. It also thins the uterine lining to prevent implantation of a fertilized egg. The hormone can weaken or kill the sperm that get into the uterus. One type of hormone IUD can stay in place for 5 years, and another type can stay in place for 3 years. Your health care provider will make sure you are a good candidate for a contraceptive IUD. Discuss with your health care provider the possible side effects.  ADVANTAGES OF AN INTRAUTERINE DEVICE  IUDs are highly effective, reversible, long acting, and low maintenance.   There are no estrogen-related side effects.   An IUD can be used when breastfeeding.   IUDs are not associated with weight gain.   The copper IUD works immediately after insertion.   The hormone IUD works right away if inserted within 7 days of your period starting. You will need to use a backup method of birth control for 7 days if the hormone IUD is inserted at any other time in your cycle.  The copper IUD does not interfere with your female hormones.   The hormone IUD can make heavy menstrual periods lighter and decrease cramping.   The hormone IUD can be used for 3 or 5 years.   The copper IUD can be used for 10 years. DISADVANTAGES OF AN INTRAUTERINE DEVICE  The hormone IUD can be associated with irregular bleeding patterns.   The copper IUD can make your menstrual flow heavier and more painful.   You may experience cramping and vaginal bleeding after insertion.    This information is not intended to replace advice given to you by your health care provider. Make sure you discuss any questions you have with your health care provider.   Document Released: 10/21/2004 Document Revised: 07/20/2013 Document Reviewed:  05/08/2013 Elsevier Interactive Patient Education [employer].

## 2016-09-25 ENCOUNTER — Ambulatory Visit: Payer: 59 | Admitting: Women's Health

## 2016-10-03 ENCOUNTER — Encounter: Payer: Self-pay | Admitting: Women's Health

## 2016-10-03 ENCOUNTER — Ambulatory Visit (INDEPENDENT_AMBULATORY_CARE_PROVIDER_SITE_OTHER): Payer: 59 | Admitting: Women's Health

## 2016-10-03 VITALS — BP 118/66 | HR 76 | Wt 176.0 lb

## 2016-10-03 DIAGNOSIS — Z30431 Encounter for routine checking of intrauterine contraceptive device: Secondary | ICD-10-CM

## 2016-10-03 NOTE — Patient Instructions (Signed)

## 2016-10-03 NOTE — Progress Notes (Signed)
   [facility] Visit  Patient name: Sydney Trujillo MRN [id number]  Date of birth: November 13, 1991  CC & HPI:  Sydney Trujillo is a 25 y.o. G38P1001 Caucasian female presenting today for IUD check. Paragard was placed 08/25/16. No problems w/ bleeding, no pain w/ sex, able to feel strings.  No LMP recorded. The current method of family planning is IUD. Last pap April 2017, neg  Pertinent History Reviewed:  Medical & Surgical Hx:   Past medical, surgical, family, and social history reviewed in electronic medical record Medications: Reviewed & Updated - see associated section Allergies: Reviewed in electronic medical record  Objective Findings:  Vitals: BP 118/66 (BP Location: Right Arm, Patient Position: Sitting, Cuff Size: Normal)   Pulse 76   Wt 176 lb (79.8 kg)   Breastfeeding? No   BMI 32.19 kg/m  Body mass index is 32.19 kg/m.  Physical Examination: General appearance - alert, well appearing, and in no distress Pelvic - IUD strings visible  No results found for this or any previous visit (from the past 24 hour(s)).   Assessment & Plan:  A:   IUD check  P:  Check strings monthly  Return for april for , Pap & physical.  Tawnya Crook CNM, [facility] 10/03/2016 11:07 AM

## 2016-10-06 ENCOUNTER — Telehealth: Payer: Self-pay | Admitting: Adult Health

## 2016-10-06 MED ORDER — AZITHROMYCIN 250 MG PO TABS: ORAL_TABLET | ORAL | 0 refills | Status: DC

## 2016-10-06 NOTE — Telephone Encounter (Signed)
Pt complains of sore throat and requests Z pack is at work, will Rx

## 2016-12-18 ENCOUNTER — Ambulatory Visit: Payer: 59 | Admitting: Adult Health

## 2016-12-26 ENCOUNTER — Ambulatory Visit (INDEPENDENT_AMBULATORY_CARE_PROVIDER_SITE_OTHER): Payer: BLUE CROSS/BLUE SHIELD | Admitting: Women's Health

## 2016-12-26 ENCOUNTER — Encounter: Payer: Self-pay | Admitting: Women's Health

## 2016-12-26 VITALS — BP 120/80 | HR 80 | Wt 178.4 lb

## 2016-12-26 DIAGNOSIS — Z30431 Encounter for routine checking of intrauterine contraceptive device: Secondary | ICD-10-CM | POA: Diagnosis not present

## 2016-12-26 NOTE — Progress Notes (Signed)
   [facility] Visit  Patient name: Sydney Trujillo MRN [id number]  Date of birth: Dec 10, 1990  CC & HPI:  Sydney Trujillo is a 26 y.o. G31P1001 Caucasian female presenting today wanting Paragard IUD strings trimmed shorter. States husband complains all the time that they are poking him. Pt not having any problems w/ IUD, does have periods, but are getting lighter.  Patient's last menstrual period was 12/09/2016. The current method of family planning is IUD. Last pap 03/2016, neg  Pertinent History Reviewed:  Medical & Surgical Hx:   Past medical, surgical, family, and social history reviewed in electronic medical record Medications: Reviewed & Updated - see associated section Allergies: Reviewed in electronic medical record  Objective Findings:  Vitals: BP 120/80   Pulse 80   Wt 178 lb 6.4 oz (80.9 kg)   LMP 12/09/2016   BMI 32.63 kg/m  Body mass index is 32.63 kg/m.  Physical Examination: General appearance - alert, well appearing, and in no distress Pelvic - Paragard IUD strings tucked behind cx, not poking out at all- discussed this w/ pt- still wants trimmed. IUD strings teased from behind cx, app 3-4cm long, trimmed in 1/2 per pt request  No results found for this or any previous visit (from the past 24 hour(s)).   Assessment & Plan:  A:   IUD check, strings trimmed per request  P:  Return April for physical  Tawnya Crook CNM, [facility] 12/26/2016 1:24 PM

## 2017-04-14 ENCOUNTER — Telehealth: Payer: Self-pay | Admitting: Adult Health

## 2017-04-14 MED ORDER — NITROFURANTOIN MONOHYD MACRO 100 MG PO CAPS: 100.0000 mg | ORAL_CAPSULE | Freq: Two times a day (BID) | ORAL | 0 refills | Status: DC

## 2017-04-14 NOTE — Telephone Encounter (Signed)
Has pain with urination and has taken AZO, will send Rx for macrobid to [facility]

## 2017-04-16 ENCOUNTER — Emergency Department (HOSPITAL_COMMUNITY): Payer: Self-pay

## 2017-04-16 ENCOUNTER — Emergency Department (HOSPITAL_COMMUNITY)
Admission: EM | Admit: 2017-04-16 | Discharge: 2017-04-16 | Disposition: A | Discharge status: Home / Self Care | Payer: Self-pay | Attending: Emergency Medicine | Admitting: Emergency Medicine

## 2017-04-16 ENCOUNTER — Encounter (HOSPITAL_COMMUNITY): Payer: Self-pay

## 2017-04-16 DIAGNOSIS — Z79899 Other long term (current) drug therapy: Secondary | ICD-10-CM | POA: Insufficient documentation

## 2017-04-16 DIAGNOSIS — E876 Hypokalemia: Secondary | ICD-10-CM

## 2017-04-16 DIAGNOSIS — N201 Calculus of ureter: Secondary | ICD-10-CM

## 2017-04-16 DIAGNOSIS — N202 Calculus of kidney with calculus of ureter: Secondary | ICD-10-CM | POA: Insufficient documentation

## 2017-04-16 DIAGNOSIS — Z87891 Personal history of nicotine dependence: Secondary | ICD-10-CM | POA: Insufficient documentation

## 2017-04-16 DIAGNOSIS — N200 Calculus of kidney: Secondary | ICD-10-CM

## 2017-04-16 DIAGNOSIS — R102 Pelvic and perineal pain: Secondary | ICD-10-CM | POA: Insufficient documentation

## 2017-04-16 DIAGNOSIS — N23 Unspecified renal colic: Secondary | ICD-10-CM

## 2017-04-16 LAB — CBC WITH DIFFERENTIAL/PLATELET
BASOS ABS: 0 10*3/uL (ref 0.0–0.1)
BASOS PCT: 0 %
EOS ABS: 0.3 10*3/uL (ref 0.0–0.7)
Eosinophils Relative: 5 %
HCT: 38.6 % (ref 36.0–46.0)
Hemoglobin: 12.8 g/dL (ref 12.0–15.0)
Lymphocytes Relative: 47 %
Lymphs Abs: 2.7 10*3/uL (ref 0.7–4.0)
MCH: 28.7 pg (ref 26.0–34.0)
MCHC: 33.2 g/dL (ref 30.0–36.0)
MCV: 86.5 fL (ref 78.0–100.0)
MONO ABS: 0.4 10*3/uL (ref 0.1–1.0)
Monocytes Relative: 6 %
NEUTROS ABS: 2.4 10*3/uL (ref 1.7–7.7)
Neutrophils Relative %: 42 %
Platelets: 226 10*3/uL (ref 150–400)
RBC: 4.46 MIL/uL (ref 3.87–5.11)
RDW: 13 % (ref 11.5–15.5)
WBC: 5.8 10*3/uL (ref 4.0–10.5)

## 2017-04-16 LAB — COMPREHENSIVE METABOLIC PANEL
ALBUMIN: 4.3 g/dL (ref 3.5–5.0)
ALK PHOS: 85 U/L (ref 38–126)
ALT: 17 U/L (ref 14–54)
ANION GAP: 8 (ref 5–15)
AST: 19 U/L (ref 15–41)
BILIRUBIN TOTAL: 0.3 mg/dL (ref 0.3–1.2)
BUN: 14 mg/dL (ref 6–20)
CALCIUM: 9.4 mg/dL (ref 8.9–10.3)
CO2: 25 mmol/L (ref 22–32)
Chloride: 104 mmol/L (ref 101–111)
Creatinine, Ser: 0.85 mg/dL (ref 0.44–1.00)
GFR calc Af Amer: >60 mL/min (ref >60)
GLUCOSE: 122 mg/dL — AB (ref 65–99)
Potassium: 3.4 mmol/L — ABNORMAL LOW (ref 3.5–5.1)
Sodium: 137 mmol/L (ref 135–145)
TOTAL PROTEIN: 7.5 g/dL (ref 6.5–8.1)

## 2017-04-16 LAB — URINALYSIS, ROUTINE W REFLEX MICROSCOPIC
BILIRUBIN URINE: NEGATIVE
GLUCOSE, UA: NEGATIVE mg/dL
Ketones, ur: NEGATIVE mg/dL
NITRITE: NEGATIVE
PH: 5 (ref 5.0–8.0)
Protein, ur: NEGATIVE mg/dL
SPECIFIC GRAVITY, URINE: 1.02 (ref 1.005–1.030)

## 2017-04-16 LAB — I-STAT BETA HCG BLOOD, ED (MC, WL, AP ONLY): I-stat hCG, quantitative: 7 m[IU]/mL — ABNORMAL HIGH (ref <5)

## 2017-04-16 LAB — HCG, QUANTITATIVE, PREGNANCY: hCG, Beta Chain, Quant, S: <1 m[IU]/mL (ref <5)

## 2017-04-16 LAB — LIPASE, BLOOD: LIPASE: 31 U/L (ref 11–51)

## 2017-04-16 MED ORDER — MORPHINE SULFATE (PF) 4 MG/ML IV SOLN
4.0000 mg | Freq: Once | INTRAVENOUS | Status: AC
  Administered 2017-04-16: 4 mg via INTRAVENOUS
  Filled 2017-04-16: qty 1

## 2017-04-16 MED ORDER — METOCLOPRAMIDE HCL 5 MG/ML IJ SOLN
10.0000 mg | Freq: Once | INTRAMUSCULAR | Status: AC
  Administered 2017-04-16: 10 mg via INTRAVENOUS
  Filled 2017-04-16: qty 2

## 2017-04-16 MED ORDER — OXYCODONE-ACETAMINOPHEN 5-325 MG PO TABS: 1.0000 | ORAL_TABLET | ORAL | 0 refills | Status: DC | PRN

## 2017-04-16 MED ORDER — MORPHINE SULFATE (PF) 4 MG/ML IV SOLN
4.0000 mg | Freq: Once | INTRAVENOUS | Status: AC
Start: 2017-04-16 — End: 2017-04-16
  Administered 2017-04-16: 4 mg via INTRAVENOUS
  Filled 2017-04-16: qty 1

## 2017-04-16 MED ORDER — DIPHENHYDRAMINE HCL 50 MG/ML IJ SOLN
25.0000 mg | Freq: Once | INTRAMUSCULAR | Status: AC
  Administered 2017-04-16: 25 mg via INTRAVENOUS
  Filled 2017-04-16: qty 1

## 2017-04-16 MED ORDER — METOCLOPRAMIDE HCL 10 MG PO TABS: 10.0000 mg | ORAL_TABLET | Freq: Four times a day (QID) | ORAL | 0 refills | Status: DC | PRN

## 2017-04-16 MED ORDER — TAMSULOSIN HCL 0.4 MG PO CAPS: 0.4000 mg | ORAL_CAPSULE | Freq: Every day | ORAL | 0 refills | Status: DC

## 2017-04-16 MED ORDER — ONDANSETRON HCL 4 MG/2ML IJ SOLN
4.0000 mg | Freq: Once | INTRAMUSCULAR | Status: AC
  Administered 2017-04-16: 4 mg via INTRAVENOUS
  Filled 2017-04-16: qty 2

## 2017-04-16 NOTE — ED Provider Notes (Signed)
[facility] DEPT Provider Note   CSN: [id number] Arrival date & time: 04/16/17  0448     History   Chief Complaint Chief Complaint  Patient presents with  . Flank Pain    HPI Sydney Trujillo is a 26 y.o. female.  The history is provided by the patient.  She has had dysuria for the last several days and received a prescription for Macrobid from her PCP. This morning, she was awakened by severe right flank pain with some radiation to the right lower abdomen. There is associated nausea without vomiting. She has had chills and sweats but no fever. She took a dose of ibuprofen without any relief. Pain is rated at 10/10. Nothing makes it better nothing makes it worse. She's never had pain like this before.  Past Medical History:  Diagnosis Date  . Abdominal pain 09/07/2013  . Constipation 11/08/2015  . Irregular bleeding 08/30/2013  . IUD (intrauterine device) in place 10/18/2013   07/2009 IUD  . Pregnant 11/08/2015  . Reflux 11/08/2015    Patient Active Problem List   Diagnosis Date Noted  . Encounter for IUD insertion 08/25/2016  . Atypical squamous cells of undetermined significance (ASCUS) on Papanicolaou smear of cervix 02/07/2016  . Constipation 11/08/2015  . Reflux 11/08/2015  . Abdominal pain 09/07/2013  . Irregular bleeding 08/30/2013    Past Surgical History:  Procedure Laterality Date  . BREAST BIOPSY  10/17/2011   Procedure: BREAST BIOPSY;  Surgeon: Donato Heinz;  Location: [facility];  Service: General;  Laterality: Left;    OB History    Gravida Para Term Preterm AB Living   1 1 1     1    SAB TAB Ectopic Multiple Live Births         0 1       Home Medications    Prior to Admission medications   Medication Sig Start Date End Date Taking? Authorizing Provider  loratadine (CLARITIN) 10 MG tablet Take 10 mg by mouth daily.   Yes [provider]  nitrofurantoin, macrocrystal-monohydrate, (MACROBID) 100 MG capsule Take 1 capsule (100 mg total) by  mouth 2 (two) times daily. 04/14/17  Yes Derrek Monaco A, NP  ranitidine (ZANTAC) 150 MG tablet Take 150 mg by mouth 2 (two) times daily.   Yes [provider]    Family History Family History  Problem Relation Age of Onset  . Hypertension Mother   . Diabetes Father   . Hypertension Father   . Diabetes Paternal Grandmother   . Hypertension Paternal Grandmother   . Fibromyalgia Paternal Grandmother   . Anesthesia problems Neg Hx   . Hypotension Neg Hx   . Malignant hyperthermia Neg Hx   . Pseudochol deficiency Neg Hx     Social History Social History  Substance Use Topics  . Smoking status: Former Smoker    Packs/day: 0.50    Years: 1.00    Types: Cigarettes    Quit date: 10/12/2009  . Smokeless tobacco: Never Used  . Alcohol use No     Allergies   Latex   Review of Systems Review of Systems  All other systems reviewed and are negative.    Physical Exam Updated Vital Signs BP (!) 144/94 (BP Location: Right Arm)   Pulse 92   Temp 97.7 F (36.5 C) (Oral)   Resp 20   Ht 5\' 2"  (1.575 m)   Wt 170 lb (77.1 kg)   SpO2 100%   BMI 31.09 kg/m  Physical Exam  Nursing note and vitals reviewed.  26 year old female, appears uncomfortable, but his in no acute distress. Vital signs are significant for hypertension. Oxygen saturation is 100%, which is normal. Head is normocephalic and atraumatic. PERRLA, EOMI. Oropharynx is clear. Neck is nontender and supple without adenopathy or JVD. Back is nontender in the midline. There is moderate right CVA tenderness. Lungs are clear without rales, wheezes, or rhonchi. Chest is nontender. Heart has regular rate and rhythm without murmur. Abdomen is soft, flat, with mild right lower quadrant tenderness. There is no rebound or guarding. There are no masses or hepatosplenomegaly and peristalsis is hypoactive. Extremities have no cyanosis or edema, full range of motion is present. Skin is warm and dry without  rash. Neurologic: Mental status is normal, cranial nerves are intact, there are no motor or sensory deficits.  ED Treatments / Results  Labs (all labs ordered are listed, but only abnormal results are displayed) Labs Reviewed  COMPREHENSIVE METABOLIC PANEL - Abnormal; Notable for the following:       Result Value   Potassium 3.4 (*)    Glucose, Bld 122 (*)    All other components within normal limits  URINALYSIS, ROUTINE W REFLEX MICROSCOPIC - Abnormal; Notable for the following:    APPearance HAZY (*)    Hgb urine dipstick MODERATE (*)    Leukocytes, UA TRACE (*)    Bacteria, UA RARE (*)    Squamous Epithelial / LPF TOO NUMEROUS TO COUNT (*)    All other components within normal limits  I-STAT BETA HCG BLOOD, ED ([facility], [facility], [facility] ONLY) - Abnormal; Notable for the following:    I-stat hCG, quantitative 7.0 (*)    All other components within normal limits  LIPASE, BLOOD  CBC WITH DIFFERENTIAL/PLATELET  HCG, QUANTITATIVE, PREGNANCY    Radiology Ct Renal Stone Study  Result Date: 04/16/2017 CLINICAL DATA:  Right flank pain, onset this morning. EXAM: CT ABDOMEN AND PELVIS WITHOUT CONTRAST TECHNIQUE: Multidetector CT imaging of the abdomen and pelvis was performed following the standard protocol without IV contrast. COMPARISON:  None. FINDINGS: Lower chest: The lung bases are clear. Hepatobiliary: No evidence of focal hepatic lesion allowing for lack contrast. Gallbladder physiologically distended, no calcified stone. No biliary dilatation. Pancreas: No ductal dilatation or inflammation. Spleen: Normal in size without focal abnormality. Adrenals/Urinary Tract: Normal adrenal glands. Moderate right hydroureteronephrosis secondary to a 4 mm stone in the distal right ureter just proximal to the ureterovesicular junction. A 4 mm calcification in the upper right kidney may be an additional nonobstructing stone versus parenchymal calcification. No left hydronephrosis. Possible nonobstructing punctate  left lower pole stone. There is faint increased density at the a renal pyramids bilaterally. Urinary bladder is decompressed. Stomach/Bowel: Stomach is within normal limits. Appendix appears normal. No evidence of bowel wall thickening, distention, or inflammatory changes. Vascular/Lymphatic: Abdominal aorta is normal in caliber. Small central mesenteric nodes are likely reactive. No retroperitoneal adenopathy. Reproductive: Intrauterine device in the uterus.  No adnexal mass. Other: Trace free fluid in the pelvis.  No free air. Musculoskeletal: There are no acute or suspicious osseous abnormalities. IMPRESSION: 1. Obstructing 4 mm stone in the distal right ureter just proximal to the ureterovesicular junction with moderate hydronephrosis. 2. 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 Possible additional bilateral punctate nonobstructing renal stones. Electronically Signed   By: Jeb Levering M.D.   On: 04/16/2017 06:49    Procedures Procedures (including critical care time)  Medications Ordered in  ED Medications  morphine 4 MG/ML injection 4 mg (4 mg Intravenous Given 04/16/17 0533)  ondansetron (ZOFRAN) injection 4 mg (4 mg Intravenous Given 04/16/17 0534)  metoCLOPramide (REGLAN) injection 10 mg (10 mg Intravenous Given 04/16/17 0559)  diphenhydrAMINE (BENADRYL) injection 25 mg (25 mg Intravenous Given 04/16/17 0559)  morphine 4 MG/ML injection 4 mg (4 mg Intravenous Given 04/16/17 0559)  morphine 4 MG/ML injection 4 mg (4 mg Intravenous Given 04/16/17 9798)     Initial Impression / Assessment and Plan / ED Course  I have reviewed the triage vital signs and the nursing notes.  Pertinent labs & imaging results that were available during my care of the patient were reviewed by me and considered in my medical decision making (see chart for details).  Right flank pain suspicious for renal colic,. Consider pyelonephritis given recent symptoms suggestive of  UTI. Old records are reviewed, and she has no relevant past visits. She is given morphine andCT shows distal ureteral calculus. She required several doses of morphine to get adequate pain relief. There was no mineral relief of nausea with ondansetron, but good relief of nausea with metoclopramide. She is discharged with prescriptions for oxycodone have acetaminophen, tamsulosin, and metoclopramide. ondansetron and will be sent for renal stone protocol CT scan. Of note, point-of-care hCG was minimally elevated at 7.0. This is felt to be probably factitious, and main lab hCG level is being checked.   Main lab hCG level is undetectable.      Final Clinical Impressions(s) / ED Diagnoses   Final diagnoses:  Ureterolithiasis  Ureteral colic  Nephrolithiasis  Hypokalemia    New Prescriptions New Prescriptions   METOCLOPRAMIDE (REGLAN) 10 MG TABLET    Take 1 tablet (10 mg total) by mouth every 6 (six) hours as needed for nausea.   OXYCODONE-ACETAMINOPHEN (PERCOCET) 5-325 MG TABLET    Take 1-2 tablets by mouth every 4 (four) hours as needed for moderate pain.   TAMSULOSIN (FLOMAX) 0.4 MG CAPS CAPSULE    Take 1 capsule (0.4 mg total) by mouth daily.     Delora Fuel, MD 92/11/94 [phone]

## 2017-04-16 NOTE — ED Triage Notes (Signed)
Pt reports onset of severe right flank pain this am, states pain wraps around to the right lower abd.  Pt started antibiotics for uti on Wednesday.

## 2017-04-16 NOTE — ED Notes (Signed)
IV access attempts x 2 without success.

## 2017-04-16 NOTE — ED Notes (Signed)
Pt called out stating she feels like she's going to pass out, entered room and instructed pt not to sit up and to lay in semi-fowlers position, pt states she is hot, cool rag placed on pts forehead, EDP present at bedside, pt states pain is unchanged, EDP to order additional pain meds

## 2017-04-16 NOTE — ED Notes (Signed)
Pt a/o x 4, vss, RX and verbal and written discharge instructions given to pt and spouse, verbalized understanding, pt ambulated off unit with a steady gait at this time

## 2017-04-23 ENCOUNTER — Telehealth: Payer: Self-pay | Admitting: Adult Health

## 2017-04-23 NOTE — Telephone Encounter (Signed)
Pt stated she had been dealing with kidney stones over the past few weeks. She stated that since then she has been having a period every other week and this was not typical after having her IUD placed Sept. 2017. Pt wanted to know if she should be seen. I advised pt to give it a little more time to see if her body would regulate itself since she had been dealing with kidney stones around the same time. I advised her that if her symptoms worsen or do not improve to give [place] a call back to be seen by a provider. Pt agreed.

## 2018-03-04 ENCOUNTER — Ambulatory Visit (INDEPENDENT_AMBULATORY_CARE_PROVIDER_SITE_OTHER): Payer: Self-pay | Admitting: Women's Health

## 2018-03-04 ENCOUNTER — Encounter: Payer: Self-pay | Admitting: Women's Health

## 2018-03-04 VITALS — BP 110/80 | HR 98 | Ht 62.0 in | Wt 175.0 lb

## 2018-03-04 DIAGNOSIS — Z30432 Encounter for removal of intrauterine contraceptive device: Secondary | ICD-10-CM

## 2018-03-04 NOTE — Progress Notes (Signed)
   IUD REMOVAL  Patient name: Sydney Trujillo MRN [id number]  Date of birth: March 01, 1991 Subjective Findings:   Sydney Trujillo is a 27 y.o. G16P1001 Caucasian female being seen today for removal of a Paragard IUD. Her IUD was placed 08/25/16.  She desires removal because of fatigue, generalized achiness, heavy periods since insertion. Thought it was r/t stress at her job so she changed offices, has seen rheumatology, etc- feels it could be copper toxicity. Signed copy of informed consent in chart.   Patient's last menstrual period was 02/13/2018. Last pap4/21/17. Results were:  normal The planned method of family planning is condoms and withdrawal Pertinent History Reviewed:   Reviewed past medical,surgical, social, obstetrical and family history.  Reviewed problem list, medications and allergies. Objective Findings & Procedure:    Vitals:   03/04/18 0842  BP: 110/80  Pulse: 98  Weight: 175 lb (79.4 kg)  Height: 5\' 2"  (1.575 m)  Body mass index is 32.01 kg/m.  No results found for this or any previous visit (from the past 24 hour(s)).   Time out was performed.  A graves speculum was placed in the vagina.  The cervix was visualized, and the strings were not visible. They were teased out w/ a cyto brush, then they were grasped and the Paragard IUD was easily removed intact without complications. The patient tolerated the procedure well.  Assessment & Plan:   1) Paragard IUD removal Follow-up prn problems  No orders of the defined types were placed in this encounter.   Follow-up: Return in about 1 year (around 03/05/2019) for Physical.  [place], WHNP-BC 03/04/2018 9:08 AM

## 2018-05-11 ENCOUNTER — Telehealth: Payer: Self-pay | Admitting: Adult Health

## 2018-05-11 MED ORDER — NORETHIN ACE-ETH ESTRAD-FE 1-20 MG-MCG PO TABS: 1.0000 | ORAL_TABLET | Freq: Every day | ORAL | 11 refills | Status: DC

## 2018-05-11 NOTE — Telephone Encounter (Signed)
Sydney Trujillo wants to start low dose OCs, use condoms for first month

## 2018-10-20 ENCOUNTER — Telehealth: Payer: Self-pay | Admitting: Adult Health

## 2018-10-20 MED ORDER — METHYLPREDNISOLONE 4 MG PO TBPK: ORAL_TABLET | ORAL | 0 refills | Status: DC

## 2018-10-20 NOTE — Telephone Encounter (Signed)
Pt complains of ears hurting and sinus pain, with some dizziness at times,has taken Z pack, will rx medrol dose pak,if not better make appt.

## 2019-05-06 ENCOUNTER — Other Ambulatory Visit: Payer: Self-pay | Admitting: Adult Health

## 2019-05-27 ENCOUNTER — Encounter: Payer: Self-pay | Admitting: Adult Health

## 2019-05-27 ENCOUNTER — Ambulatory Visit (INDEPENDENT_AMBULATORY_CARE_PROVIDER_SITE_OTHER): Payer: 59 | Admitting: Adult Health

## 2019-05-27 ENCOUNTER — Other Ambulatory Visit: Payer: Self-pay

## 2019-05-27 VITALS — BP 124/79 | HR 81 | Ht 62.0 in | Wt 159.5 lb

## 2019-05-27 DIAGNOSIS — R319 Hematuria, unspecified: Secondary | ICD-10-CM

## 2019-05-27 DIAGNOSIS — R350 Frequency of micturition: Secondary | ICD-10-CM | POA: Insufficient documentation

## 2019-05-27 LAB — POCT URINALYSIS DIPSTICK
Glucose, UA: NEGATIVE
Ketones, UA: NEGATIVE
Leukocytes, UA: NEGATIVE
Nitrite, UA: NEGATIVE
Protein, UA: NEGATIVE

## 2019-05-27 MED ORDER — SOLIFENACIN SUCCINATE 10 MG PO TABS: 10.0000 mg | ORAL_TABLET | Freq: Every day | ORAL | 3 refills | Status: DC

## 2019-05-27 NOTE — Progress Notes (Signed)
Patient ID: Sydney Trujillo, female   DOB: [birth year], 28 y.o.   MRN: [id number] History of Present Illness: Sydney Trujillo is a 28 year old white female, married, G1P1 in complaining of urinary frequency, no pain. She has RA. PCP is Dr Nevada Crane.   Current Medications, Allergies, Past Medical History, Past Surgical History, Family History and Social History were reviewed in [employer] record.     Review of Systems: +urinary frequency, no pain Has history of kidney stone 2018.   Physical Exam:BP 124/79 (BP Location: Left Arm, Patient Position: Sitting, Cuff Size: Normal)   Pulse 81   Ht 5\' 2"  (1.575 m)   Wt 159 lb 8 oz (72.3 kg)   LMP 04/28/2019   BMI 29.17 kg/m  urine dipstick trace blood.  General:  Well developed, well nourished, no acute distress Skin:  Warm and dry Lungs; Clear to auscultation bilaterally Cardiovascular: Regular rate and rhythm Psych:  No mood changes, alert and cooperative,seems happy,fall risk is low.   Impression: 1. Urinary frequency   2. Hematuria, unspecified type       Plan: Will try vesicare 10 mg 1 daily and let me know if helps and take 1/2 tablet if 10 mg has side effects  Meds ordered this encounter  Medications  . solifenacin (VESICARE) 10 MG tablet    Sig: Take 1 tablet (10 mg total) by mouth daily.    Dispense:  30 tablet    Refill:  3    Order Specific Question:   Supervising Provider    Answer:   Tania Ade H [[id number]]  Follow up prn

## 2019-12-09 ENCOUNTER — Other Ambulatory Visit: Payer: Self-pay | Admitting: Adult Health

## 2020-02-28 ENCOUNTER — Other Ambulatory Visit: Payer: Self-pay | Admitting: Adult Health

## 2020-03-16 ENCOUNTER — Other Ambulatory Visit (HOSPITAL_COMMUNITY)
Admission: RE | Admit: 2020-03-16 | Discharge: 2020-03-16 | Disposition: A | Discharge status: Home / Self Care | Payer: Self-pay | Source: Ambulatory Visit | Attending: Obstetrics & Gynecology | Admitting: Obstetrics & Gynecology

## 2020-03-16 ENCOUNTER — Encounter: Payer: Self-pay | Admitting: Women's Health

## 2020-03-16 ENCOUNTER — Ambulatory Visit: Payer: Self-pay | Admitting: Women's Health

## 2020-03-16 ENCOUNTER — Other Ambulatory Visit: Payer: Self-pay

## 2020-03-16 VITALS — BP 120/78 | HR 95 | Ht 62.0 in | Wt 155.8 lb

## 2020-03-16 DIAGNOSIS — Z9889 Other specified postprocedural states: Secondary | ICD-10-CM | POA: Insufficient documentation

## 2020-03-16 DIAGNOSIS — Z1151 Encounter for screening for human papillomavirus (HPV): Secondary | ICD-10-CM

## 2020-03-16 DIAGNOSIS — Z01419 Encounter for gynecological examination (general) (routine) without abnormal findings: Secondary | ICD-10-CM

## 2020-03-16 DIAGNOSIS — Z3009 Encounter for other general counseling and advice on contraception: Secondary | ICD-10-CM

## 2020-03-16 NOTE — Progress Notes (Signed)
WELL-WOMAN EXAMINATION Patient name: Sydney Trujillo MRN [id number]  Date of birth: [birth year] Chief Complaint:   Gynecologic Exam (discuss IUD)  History of Present Illness:   Sydney Trujillo is a 29 y.o. G6P1001 Caucasian female being seen today for a routine well-woman exam.  Current complaints: wants Paragard IUD again. Had in 2017, removed in 2019 d/t fatigue, achiness, heavy periods- was subsequently dx w/ RA, feels that caused most of her sx. Currently on COCs.   PCP: Nevada Crane      does not desire labs Patient's last menstrual period was 03/02/2020 (approximate). The current method of family planning is OCP (estrogen/progesterone).  Last pap 2017. Results were: normal. H/O abnormal pap: yes ASCUS 2016 Last mammogram: breast u/s 2012 @ 29yo w/ probable fibroadenoma subsequently removed- was told it was benign but abnormal appearing and recommended to have mammograms q 38yrs. Has not done this, and does not want to right now, 'I haven't felt anything else.' Biopsy results were: complex sclerosing lesion including intraductal papilloma, extensive sclerosing adenosis, apocrine metaplasia and cysts. No atypia or malignancy. Inked resection margins are negative for atypia or malignancy. Family h/o breast cancer: yes MA in 48s Last colonoscopy: never. Results were: N/A. Family h/o colorectal cancer: no Review of Systems:   Pertinent items are noted in HPI Denies any headaches, blurred vision, fatigue, shortness of breath, chest pain, abdominal pain, abnormal vaginal discharge/itching/odor/irritation, problems with periods, bowel movements, urination, or intercourse unless otherwise stated above. Pertinent History Reviewed:  Reviewed past medical,surgical, social and family history.  Reviewed problem list, medications and allergies. Physical Assessment:   Vitals:   03/16/20 0855  BP: 120/78  Pulse: 95  Weight: 155 lb 12.8 oz (70.7 kg)  Height: 5\' 2"  (1.575 m)  Body mass index is 28.5  kg/m.        Physical Examination:   General appearance - well appearing, and in no distress  Mental status - alert, oriented to person, place, and time  Psych:  She has a normal mood and affect  Skin - warm and dry, normal color, no suspicious lesions noted  Chest - effort normal, all lung fields clear to auscultation bilaterally  Heart - normal rate and regular rhythm  Neck:  midline trachea, no thyromegaly or nodules  Breasts - breasts appear normal, no suspicious masses, no skin or nipple changes or  axillary nodes  Abdomen - soft, nontender, nondistended, no masses or organomegaly  Pelvic - VULVA: normal appearing vulva with no masses, tenderness or lesions  VAGINA: normal appearing vagina with normal color and discharge, no lesions  CERVIX: normal appearing cervix without discharge or lesions, no CMT  Thin prep pap is done w/ HR HPV cotesting  UTERUS: uterus is felt to be normal size, shape, consistency and nontender   ADNEXA: No adnexal masses or tenderness noted.  Extremities:  No swelling or varicosities noted  Chaperone: Levy Pupa    No results found for this or any previous visit (from the past 24 hour(s)).  Assessment & Plan:  1) Well-Woman Exam  2) Contraception counseling> wants Paragard IUD, last sex 4/10, period coming up around 4/27- wants appt on a Friday, so will plan for 4/30. Abstinence until after insertion  3) H/O abnormal but benign breast mass> @ 29yo, was told to have mammograms q72yrs, hasn't yet, and doesn't want to right now. Breast exam normal today.   Labs/procedures today: pap  Mammogram-definitely at 29yo, or sooner if problems or decides to go ahead w/ screening  based on previous recommendations Colonoscopy @29yo  or sooner if problems  No orders of the defined types were placed in this encounter.   Meds: No orders of the defined types were placed in this encounter.   Follow-up: Return in about 2 weeks (around 03/30/2020) for IUD  insertion.  [place], [facility] 03/16/2020 9:32 AM

## 2020-03-16 NOTE — Patient Instructions (Signed)
NO SEX UNTIL AFTER YOU GET YOUR BIRTH CONTROL  

## 2020-03-19 LAB — CYTOLOGY - PAP
Comment: NEGATIVE
Diagnosis: NEGATIVE
High risk HPV: NEGATIVE

## 2020-03-30 ENCOUNTER — Other Ambulatory Visit: Payer: Self-pay

## 2020-03-30 ENCOUNTER — Ambulatory Visit (INDEPENDENT_AMBULATORY_CARE_PROVIDER_SITE_OTHER): Payer: Self-pay | Admitting: Women's Health

## 2020-03-30 ENCOUNTER — Encounter: Payer: Self-pay | Admitting: Women's Health

## 2020-03-30 VITALS — BP 136/84 | HR 97 | Ht 62.0 in

## 2020-03-30 DIAGNOSIS — Z3202 Encounter for pregnancy test, result negative: Secondary | ICD-10-CM

## 2020-03-30 DIAGNOSIS — Z30430 Encounter for insertion of intrauterine contraceptive device: Secondary | ICD-10-CM

## 2020-03-30 LAB — POCT URINE PREGNANCY: Preg Test, Ur: NEGATIVE

## 2020-03-30 NOTE — Progress Notes (Signed)
   IUD INSERTION Patient name: Sydney Trujillo MRN [id number]  Date of birth: 1991-07-18 Subjective Findings:   Sydney Trujillo is a 29 y.o. G21P1001 Caucasian female being seen today for insertion of a Paragard IUD.  No flowsheet data found.  Patient's last menstrual period was 03/21/2020. Last sexual intercourse was >2wks ago Last pap4/16/21. Results were:  normal  The risks and benefits of the method and placement have been thouroughly reviewed with the patient and all questions were answered.  Specifically the patient is aware of failure rate of 12/998, expulsion of the IUD and of possible perforation.  The patient is aware of irregular bleeding due to the method and understands the incidence of irregular bleeding diminishes with time.  Signed copy of informed consent in chart.  Pertinent History Reviewed:   Reviewed past medical,surgical, social, obstetrical and family history.  Reviewed problem list, medications and allergies. Objective Findings & Procedure:   Vitals:   03/30/20 0836  BP: 136/84  Pulse: 97  Height: 5\' 2"  (1.575 m)  Body mass index is 28.5 kg/m.  Results for orders placed or performed in visit on 03/30/20 (from the past 24 hour(s))  POCT urine pregnancy   Collection Time: 03/30/20  8:42 AM  Result Value Ref Range   Preg Test, Ur Negative Negative     Time out was performed.  A graves speculum was placed in the vagina.  The cervix was visualized, prepped using Betadine, and grasped with a single tooth tenaculum. The uterus was found to be neutral and it sounded to 7 cm.  Paragard  IUD placed per manufacturer's recommendations. The strings were trimmed to approximately 3 cm. The patient tolerated the procedure well.   Informal transvaginal sonogram was performed and the proper placement of the IUD was verified.  Chaperone: Firefighter & Plan:   1) Paragard IUD insertion The patient was given post procedure instructions, including signs and  symptoms of infection and to check for the strings after each menses or each month, and refraining from intercourse or anything in the vagina for 3 days. She was given a care card with date IUD placed, and date IUD to be removed. She is scheduled for a f/u appointment in 4 weeks.  Orders Placed This Encounter  Procedures  . POCT urine pregnancy    Return in about 4 weeks (around 04/27/2020) for F/U, CNM (can be in person or online).  [place], [facility] 03/30/2020 9:09 AM

## 2020-03-30 NOTE — Patient Instructions (Signed)
 Nothing in vagina for 3 days (no sex, douching, tampons, etc...)  Check your strings once a month to make sure you can feel them, if you are not able to please let us know  If you develop a fever of 100.4 or more in the next few weeks, or if you develop severe abdominal pain, please let us know  Use a backup method of birth control, such as condoms, for 2 weeks     Intrauterine Device Insertion, Care After  This sheet gives you information about how to care for yourself after your procedure. Your health care provider may also give you more specific instructions. If you have problems or questions, contact your health care provider. What can I expect after the procedure? After the procedure, it is common to have:  Cramps and pain in the abdomen.  Light bleeding (spotting) or heavier bleeding that is like your menstrual period. This may last for up to a few days.  Lower back pain.  Dizziness.  Headaches.  Nausea. Follow these instructions at home:  Before resuming sexual activity, check to make sure that you can feel the IUD string(s). You should be able to feel the end of the string(s) below the opening of your cervix. If your IUD string is in place, you may resume sexual activity. ? If you had a hormonal IUD inserted more than 7 days after your most recent period started, you will need to use a backup method of birth control for 7 days after IUD insertion. Ask your health care provider whether this applies to you.  Continue to check that the IUD is still in place by feeling for the string(s) after every menstrual period, or once a month.  Take over-the-counter and prescription medicines only as told by your health care provider.  Do not drive or use heavy machinery while taking prescription pain medicine.  Keep all follow-up visits as told by your health care provider. This is important. Contact a health care provider if:  You have bleeding that is heavier or lasts longer  than a normal menstrual cycle.  You have a fever.  You have cramps or abdominal pain that get worse or do not get better with medicine.  You develop abdominal pain that is new or is not in the same area of earlier cramping and pain.  You feel lightheaded or weak.  You have abnormal or bad-smelling discharge from your vagina.  You have pain during sexual activity.  You have any of the following problems with your IUD string(s): ? The string bothers or hurts you or your sexual partner. ? You cannot feel the string. ? The string has gotten longer.  You can feel the IUD in your vagina.  You think you may be pregnant, or you miss your menstrual period.  You think you may have an STI (sexually transmitted infection). Get help right away if:  You have flu-like symptoms.  You have a fever and chills.  You can feel that your IUD has slipped out of place. Summary  After the procedure, it is common to have cramps and pain in the abdomen. It is also common to have light bleeding (spotting) or heavier bleeding that is like your menstrual period.  Continue to check that the IUD is still in place by feeling for the string(s) after every menstrual period, or once a month.  Keep all follow-up visits as told by your health care provider. This is important.  Contact your health care provider   if you have problems with your IUD string(s), such as the string getting longer or bothering you or your sexual partner. This information is not intended to replace advice given to you by your health care provider. Make sure you discuss any questions you have with your health care provider. Document Revised: 10/30/2017 Document Reviewed: 10/08/2016 Elsevier Patient Education  2020 Elsevier Inc.  

## 2020-04-11 MED ORDER — PARAGARD INTRAUTERINE COPPER IU IUD: INTRAUTERINE_SYSTEM | Freq: Once | INTRAUTERINE | Status: AC

## 2020-04-11 NOTE — Addendum Note (Signed)
Addended by: Linton Rump on: 04/11/2020 12:08 PM   Modules accepted: Orders

## 2020-04-16 ENCOUNTER — Telehealth: Payer: Self-pay | Admitting: Adult Health

## 2020-04-16 MED ORDER — MEGESTROL ACETATE 40 MG PO TABS: ORAL_TABLET | ORAL | 1 refills | Status: DC

## 2020-04-16 NOTE — Telephone Encounter (Signed)
Having bleeding with parguard IUD, requesting meds to stop bleeding, will rx megace

## 2020-04-27 ENCOUNTER — Ambulatory Visit: Payer: Self-pay | Admitting: Women's Health

## 2020-04-27 ENCOUNTER — Encounter: Payer: Self-pay | Admitting: Women's Health

## 2020-04-27 VITALS — BP 122/78 | HR 88 | Ht 62.0 in | Wt 156.0 lb

## 2020-04-27 DIAGNOSIS — Z30431 Encounter for routine checking of intrauterine contraceptive device: Secondary | ICD-10-CM

## 2020-04-27 NOTE — Progress Notes (Signed)
   GYN VISIT Patient name: Sydney Trujillo MRN [id number]  Date of birth: 12-25-1990 Chief Complaint:   iud check  History of Present Illness:   Sydney Trujillo is a 29 y.o. G38P1001 Caucasian female being seen today for IUD check. Paragard IUD inserted 03/30/20. Had a lot of cramping to begin with which has since resolved, no pain at all now. No dyspareunia. Bleeding heavy at first, [facility] sent her in some megace on 04/16/20, still bleeds daily, but seems to be getting better.  No flowsheet data found.  No LMP recorded. The current method of family planning is IUD.  Last pap 03/16/20. Results were:  normal Review of Systems:   Pertinent items are noted in HPI Denies fever/chills, dizziness, headaches, visual disturbances, fatigue, shortness of breath, chest pain, abdominal pain, vomiting, abnormal vaginal discharge/itching/odor/irritation, problems with periods, bowel movements, urination, or intercourse unless otherwise stated above.  Pertinent History Reviewed:  Reviewed past medical,surgical, social, obstetrical and family history.  Reviewed problem list, medications and allergies. Physical Assessment:   Vitals:   04/27/20 1230  BP: 122/78  Pulse: 88  Weight: 156 lb (70.8 kg)  Height: 5\' 2"  (1.575 m)  Body mass index is 28.53 kg/m.       Physical Examination:   General appearance: alert, well appearing, and in no distress  Mental status: alert, oriented to person, place, and time  Skin: warm & dry   Cardiovascular: normal heart rate noted  Respiratory: normal respiratory effort, no distress  Abdomen: not examined  Pelvic: VULVA: normal appearing vulva with no masses, tenderness or lesions, VAGINA: normal appearing vagina with normal color and discharge, no lesions, small amt menstrual blood, nonodorous CERVIX: normal appearing cervix without discharge or lesions, IUD strings visible, appropriate length  Extremities: no edema  Informal TA u/s: IUD in correct fundal position w/in  the endometrium  Chaperone: Amanda Rash    No results found for this or any previous visit (from the past 24 hour(s)).  Assessment & Plan:  1) IUD check> IUD in correct place, still bleeding daily, but seems to be improving on megace. Let us know if worsens  Meds: No orders of the defined types were placed in this encounter.   No orders of the defined types were placed in this encounter.   Return in about 1 year (around 04/27/2021) for Physical.  [place], [facility] 04/27/2020 12:44 PM

## 2020-05-14 ENCOUNTER — Other Ambulatory Visit: Payer: Self-pay | Admitting: Adult Health

## 2020-06-15 ENCOUNTER — Ambulatory Visit: Payer: Self-pay | Admitting: Advanced Practice Midwife

## 2020-07-19 ENCOUNTER — Other Ambulatory Visit: Payer: Self-pay | Admitting: Internal Medicine

## 2020-07-19 ENCOUNTER — Other Ambulatory Visit: Payer: Self-pay

## 2020-07-19 ENCOUNTER — Ambulatory Visit
Admission: RE | Admit: 2020-07-19 | Discharge: 2020-07-19 | Disposition: A | Discharge status: Home / Self Care | Payer: Self-pay | Source: Ambulatory Visit | Attending: Internal Medicine | Admitting: Internal Medicine

## 2020-07-19 DIAGNOSIS — R1084 Generalized abdominal pain: Secondary | ICD-10-CM

## 2020-07-19 MED ORDER — IOPAMIDOL (ISOVUE-300) INJECTION 61%
100.0000 mL | Freq: Once | INTRAVENOUS | Status: AC | PRN
  Administered 2020-07-19: 100 mL via INTRAVENOUS

## 2020-07-26 ENCOUNTER — Ambulatory Visit (INDEPENDENT_AMBULATORY_CARE_PROVIDER_SITE_OTHER): Payer: Self-pay | Admitting: Gastroenterology

## 2020-08-16 ENCOUNTER — Ambulatory Visit (INDEPENDENT_AMBULATORY_CARE_PROVIDER_SITE_OTHER): Payer: Self-pay | Admitting: Gastroenterology

## 2020-08-16 ENCOUNTER — Other Ambulatory Visit: Payer: Self-pay

## 2020-08-16 ENCOUNTER — Encounter (INDEPENDENT_AMBULATORY_CARE_PROVIDER_SITE_OTHER): Payer: Self-pay | Admitting: Gastroenterology

## 2020-08-16 VITALS — BP 110/77 | HR 102 | Temp 98.3°F | Ht 62.0 in | Wt 161.1 lb

## 2020-08-16 DIAGNOSIS — R1032 Left lower quadrant pain: Secondary | ICD-10-CM

## 2020-08-16 DIAGNOSIS — R197 Diarrhea, unspecified: Secondary | ICD-10-CM

## 2020-08-16 DIAGNOSIS — R1013 Epigastric pain: Secondary | ICD-10-CM

## 2020-08-16 MED ORDER — HYOSCYAMINE SULFATE SL 0.125 MG SL SUBL: 0.1250 mg | SUBLINGUAL_TABLET | Freq: Two times a day (BID) | SUBLINGUAL | 1 refills | Status: AC | PRN

## 2020-08-16 MED ORDER — HYOSCYAMINE SULFATE SL 0.125 MG SL SUBL
0.1250 mg | SUBLINGUAL_TABLET | Freq: Two times a day (BID) | SUBLINGUAL | 1 refills | Status: DC | PRN
Start: 2020-08-16 — End: 2020-08-16

## 2020-08-16 NOTE — Progress Notes (Signed)
Patient profile: Sydney Trujillo is a 29 y.o. female seen for evaluation of abdominal pain and diarrhea.  Referred by Dr. Nevada Crane.  History of Present Illness: Sydney Trujillo is seen today for evaluation of GI symptoms.  She reports initially began in January 2021 when she started methotrexate.  Had mainly pain after eating salads which she is now avoiding.  Stopped methotrexate in March. She continues to have once a month issues with abdominal pain and diarrhea despite avoiding salads - describes can wake up in the night.  Typically begins as epigastric pain radiating down to her left lower side associated with diarrhea.  Can have mild residual pain for 1 to 2 days after.  Describes diarrhea as explosive loose stools.  She feels nauseated and occasionally has had vomiting with the episodes  She has not noted specific food triggers but has not kept a specific food journal. Episodes in generally occur 1-2x month   She between episodes feels well and has formed stools.  She stopped Celebrex a few months ago, previously was using ibuprofen a couple times a week but now is mainly using Tylenol.  She denies tobacco or alcohol intake.  She had a 1 week course of Flagyl prescribed for primary care last month during episode which helped her symptoms some.  She also notes has had less symptoms since omeprazole was increased from 20 mg once a day to twice a day.  She is not having GERD symptoms.  No dysphagia.  She denies any known family history of inflammatory bowel disease, celiac disease, colon cancer, colon polyps.  Wt Readings from Last 3 Encounters:  08/16/20 161 lb 1.6 oz (73.1 kg)  04/27/20 156 lb (70.8 kg)  03/16/20 155 lb 12.8 oz (70.7 kg)     Last Colonoscopy: none prior Last Endoscopy: none prior    Past Medical History:  Past Medical History:  Diagnosis Date  . Abdominal pain 09/07/2013  . Arthritis    Rhuematoid   . Constipation 11/08/2015  . High cholesterol   . Irregular bleeding  08/30/2013  . IUD (intrauterine device) in place 10/18/2013   07/2009 IUD  . Lupus ([place])   . Pregnant 11/08/2015  . Reflux 11/08/2015  . Rheumatoid arthritis ([place])     Problem List: Patient Active Problem List   Diagnosis Date Noted  . History of breast lump/mass excision 03/16/2020  . Hematuria 05/27/2019  . Encounter for IUD insertion 08/25/2016  . Atypical squamous cells of undetermined significance (ASCUS) on Papanicolaou smear of cervix 02/07/2016  . Constipation 11/08/2015  . Reflux 11/08/2015  . Abdominal pain 09/07/2013  . Irregular bleeding 08/30/2013    Past Surgical History: Past Surgical History:  Procedure Laterality Date  . BREAST BIOPSY  10/17/2011   Procedure: BREAST BIOPSY;  Surgeon: Donato Heinz;  Location: [facility];  Service: General;  Laterality: Left;    Allergies: Allergies  Allergen Reactions  . Latex Itching      Home Medications:  Current Outpatient Medications:  .  baricitinib (OLUMIANT) tablet, Take 2 mg by mouth daily., Disp: , Rfl:  .  cetirizine (ZYRTEC) 10 MG tablet, Take 10 mg by mouth daily., Disp: , Rfl:  .  folic acid (FOLVITE) 1 MG tablet, Take 1 mg by mouth daily., Disp: , Rfl:  .  hydroxychloroquine (PLAQUENIL) 200 MG tablet, TAKE TWO (2) TABLETS BY MOUTH ONCE DAILY., Disp: , Rfl:  .  omeprazole (PRILOSEC) 20 MG capsule, Take 20 mg by mouth 2 (two) times  daily before a meal. , Disp: , Rfl:  .  celecoxib (CELEBREX) 200 MG capsule, Take 200 mg by mouth daily.  (Patient not taking: Reported on 08/16/2020), Disp: , Rfl:  .  Hyoscyamine Sulfate SL (LEVSIN/SL) 0.125 MG SUBL, Place 0.125 mg under the tongue 2 (two) times daily as needed (abd pain)., Disp: 30 tablet, Rfl: 1   Family History: family history includes Cancer in her paternal grandfather; Diabetes in her father and paternal grandmother; Fibromyalgia in her paternal grandmother; Hypertension in her father, mother, and paternal grandmother.    Social History:   reports that she quit  smoking about 10 years ago. Her smoking use included cigarettes. She has a 0.50 pack-year smoking history. She has never used smokeless tobacco. She reports that she does not drink alcohol and does not use drugs.   Review of Systems: Constitutional: Denies weight loss/weight gain  Eyes: No changes in vision. ENT: No oral lesions, sore throat.  GI: see HPI.  Heme/Lymph: No easy bruising.  CV: No chest pain.  GU: No hematuria.  Integumentary: No rashes.  Neuro: No headaches.  Psych: No depression/anxiety.  Endocrine: No heat/cold intolerance.  Allergic/Immunologic: No urticaria.  Resp: No cough, SOB.  Musculoskeletal: No joint swelling.    Physical Examination: BP 110/77 (BP Location: Right Arm, Patient Position: Sitting, Cuff Size: Normal)   Pulse (!) 102   Temp 98.3 F (36.8 C) (Oral)   Ht 5\' 2"  (1.575 m)   Wt 161 lb 1.6 oz (73.1 kg)   BMI 29.47 kg/m  Gen: NAD, alert and oriented x 4 HEENT: PEERLA, EOMI, Neck: supple, no JVD Chest: CTA bilaterally, no wheezes, crackles, or other adventitious sounds CV: RRR, no m/g/c/r Abd: soft, NT, ND, +BS in all four quadrants; no HSM, guarding, ridigity, or rebound tenderness Ext: no edema, well perfused with 2+ pulses, Skin: no rash or lesions noted on observed skin Lymph: no noted LAD  Data:  CT a/p 07/2020 - IMPRESSION: 1. No explanation for LEFT-sided pain. No diverticulitis. No ureterolithiasis or obstructive uropathy. 2. Dominant follicle of the LEFT ovary. Ovaries and uterus normal. IUD expected location. 3. Normal appendix. 4. Small hypodense lesion in the liver too small to characterize but favored benign. 5. Small nonobstructing calculus in the RIGHT kidney   Rheumatology labs from [place] reviewed on patient's phone during visit 08/10/20- cbc, cmp, tsh normal   05/2020-sed rate and crp normal  Assessment/Plan: Sydney Trujillo is a 29 y.o. female  Sydney Trujillo was seen today for abdominal pain.  Diagnoses and  all orders for this visit:  Diarrhea, unspecified type -     Celiac Disease Panel  Abdominal pain, epigastric -     Celiac Disease Panel  Abdominal pain, left lower quadrant -     Celiac Disease Panel  Other orders -     Discontinue: Hyoscyamine Sulfate SL (LEVSIN/SL) 0.125 MG SUBL; Place 0.125 mg under the tongue 2 (two) times daily as needed (abd pain). -     Hyoscyamine Sulfate SL (LEVSIN/SL) 0.125 MG SUBL; Place 0.125 mg under the tongue 2 (two) times daily as needed (abd pain).      1.  Abdominal pain diarrhea-intermittent symptoms 1-2 times a month alternating with completely normal bowel movements without frequent abdominal pain raises suspicion for possible food sensitivity, IBS, etc.  Less likely inflammatory bowel disease or infectious etiology given diarrhea resolves and she feels well between episodes.  CT above was reassuring last month.  We will begin work-up with a celiac  panel. Inflammatory markers normal. She is actually feeling some better since being on omeprazole 20 mg twice a day instead of 20 mg once a day will continue this.  We will give her Levsin to use as needed.  Further recommendations pending.  If symptoms continue or increase in frequency would consider colonoscopy.  F/up in 1 month - call sooner if needed  I personally performed the service, non-incident to. (WP)  Laurine Blazer, [facility]

## 2020-08-16 NOTE — Patient Instructions (Signed)
We are checking labs for celiac disease.  I sent medication to your pharmacy to try in the interim for abdominal pain as needed.  We will contact you with results. Important to keep food journal log.

## 2020-08-17 LAB — CELIAC DISEASE PANEL
(tTG) Ab, IgA: 1 U/mL
(tTG) Ab, IgG: 9 U/mL — ABNORMAL HIGH
Gliadin IgA: 2 Units
Gliadin IgG: 1 Units
Immunoglobulin A: 88 mg/dL (ref 47–310)

## 2020-08-27 ENCOUNTER — Ambulatory Visit (INDEPENDENT_AMBULATORY_CARE_PROVIDER_SITE_OTHER): Payer: Self-pay | Admitting: Gastroenterology

## 2020-09-17 ENCOUNTER — Other Ambulatory Visit: Payer: Self-pay | Admitting: Women's Health

## 2020-09-17 MED ORDER — NITROFURANTOIN MONOHYD MACRO 100 MG PO CAPS: 100.0000 mg | ORAL_CAPSULE | Freq: Two times a day (BID) | ORAL | 0 refills | Status: AC

## 2020-09-17 NOTE — Progress Notes (Signed)
Pt contacted me w/ report of burning w/ urination, bleeding, nausea. Rx macrobid. Roma Schanz, CNM, [facility] 09/17/2020 1:43 PM

## 2021-01-30 ENCOUNTER — Telehealth: Payer: Self-pay | Admitting: Adult Health

## 2021-01-30 MED ORDER — FLUCONAZOLE 150 MG PO TABS: ORAL_TABLET | ORAL | 1 refills | Status: DC

## 2021-01-30 NOTE — Telephone Encounter (Signed)
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

## 2021-11-09 IMAGING — CT CT ABD-PELV W/ CM
1 of 2 series · 14 of 32 positions shown, 19 images · IV contrast (iopamidol)
Comparison: CT 04/16/2017

CLINICAL DATA: LEFT-sided abdominal pain. Nausea and vomiting.
Diarrhea.

EXAM:
CT ABDOMEN AND PELVIS WITH CONTRAST
TECHNIQUE: Multidetector CT imaging of the abdomen and pelvis was performed
using the standard protocol following bolus administration of
intravenous contrast.
CONTRAST:  100mL [id number] IOPAMIDOL ([id number]-[id number]) INJECTION 61%

[Series 2: abd/pelvis w/cm · axial · 0.78mm/px · z∈[-380,+20]mm · 14 of 90 slices shown, 19 images]
[im 5/90  soft-tissue]
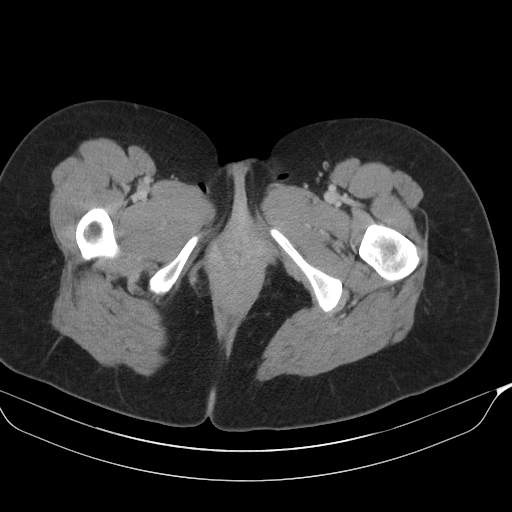
[im 5/90  bone]
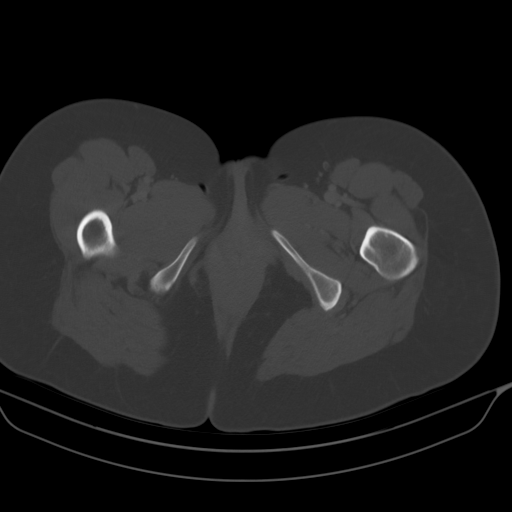
[im 15/90  soft-tissue]
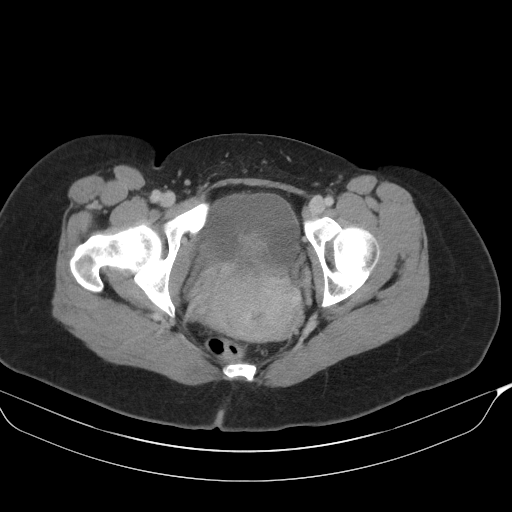
[im 19/90  soft-tissue]
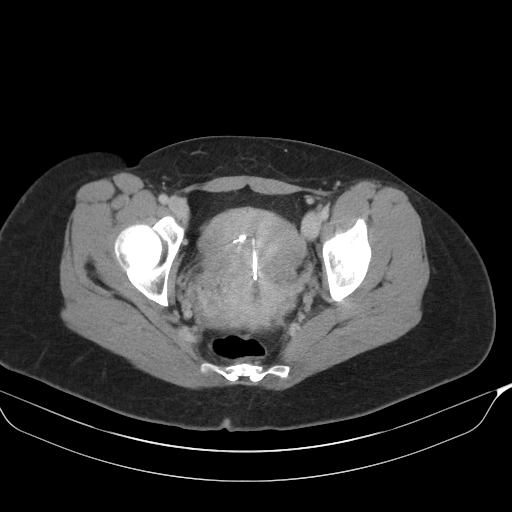
[im 24/90  soft-tissue]
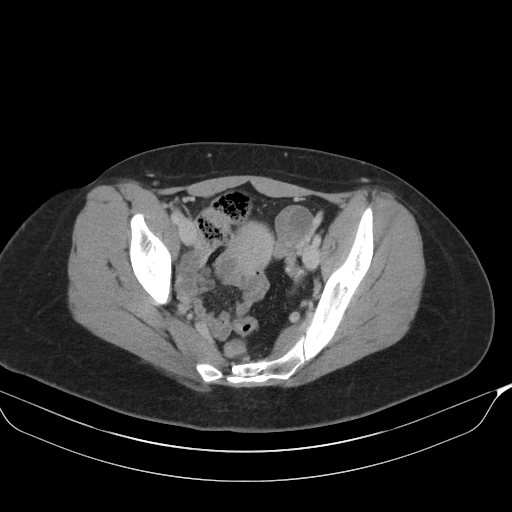
[im 33/90  soft-tissue]
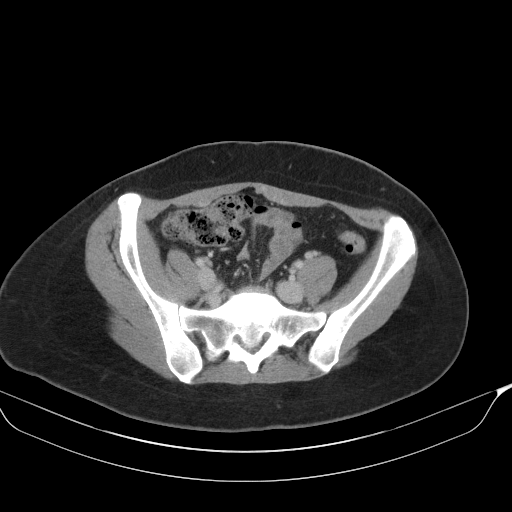
[im 38/90  soft-tissue]
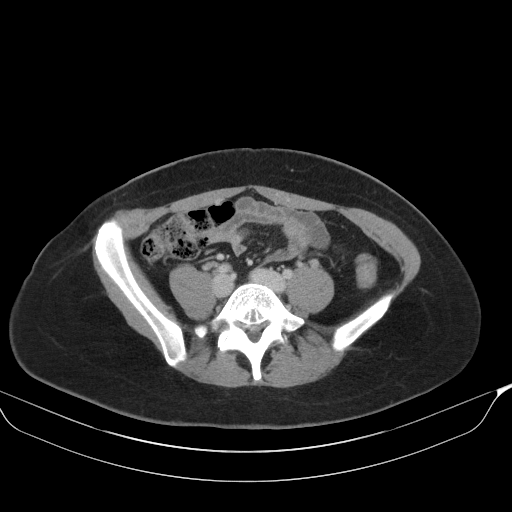
[im 47/90  soft-tissue]
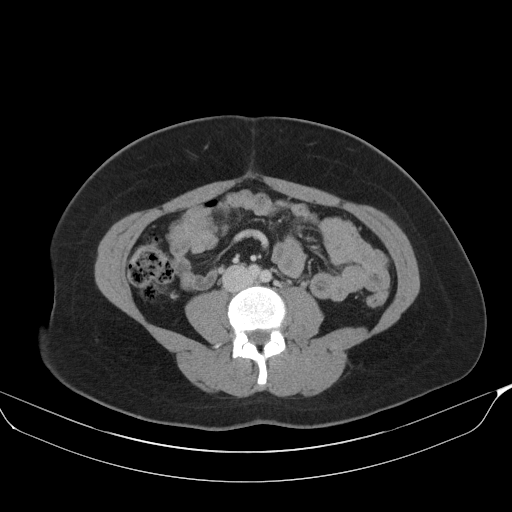
[im 52/90  soft-tissue]
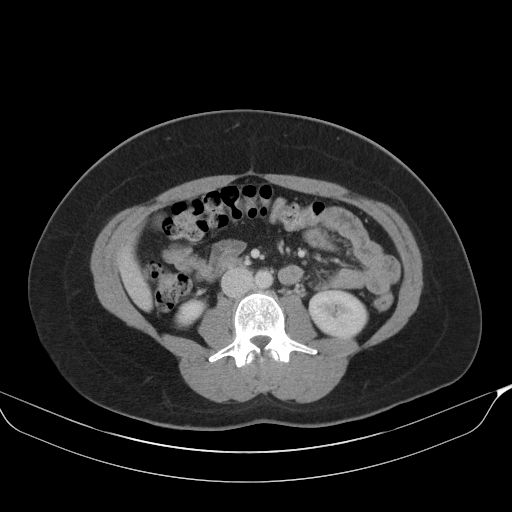
[im 57/90  soft-tissue]
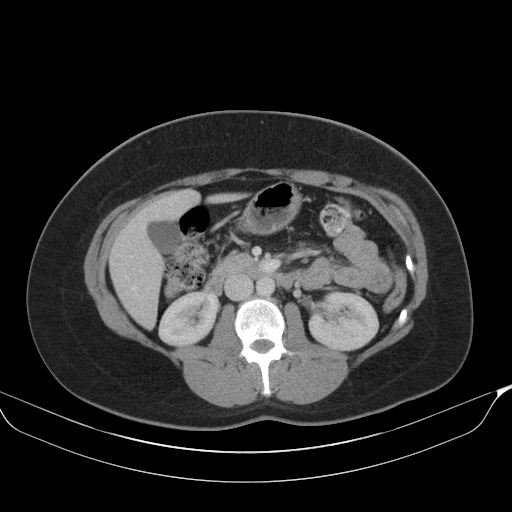
[im 57/90  bone]
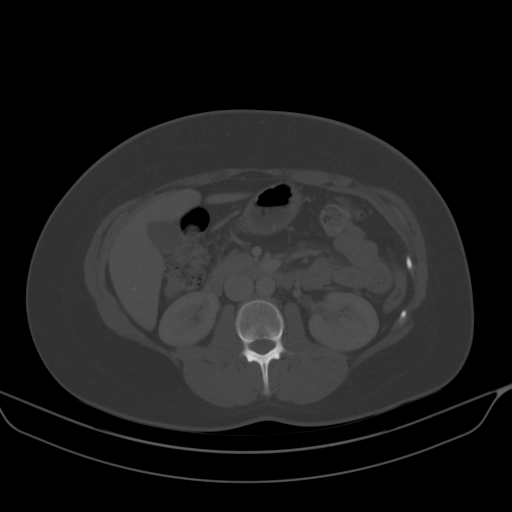
[im 66/90  soft-tissue]
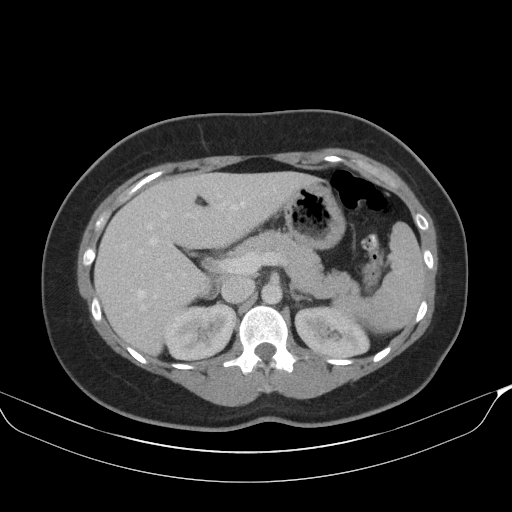
[im 71/90  soft-tissue]
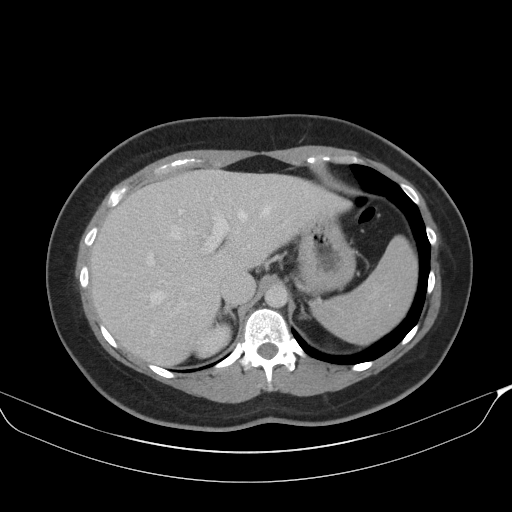
[im 71/90  lung]
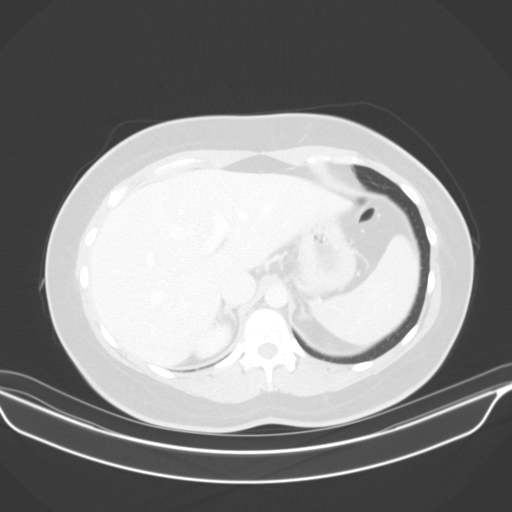
[im 75/90  soft-tissue]
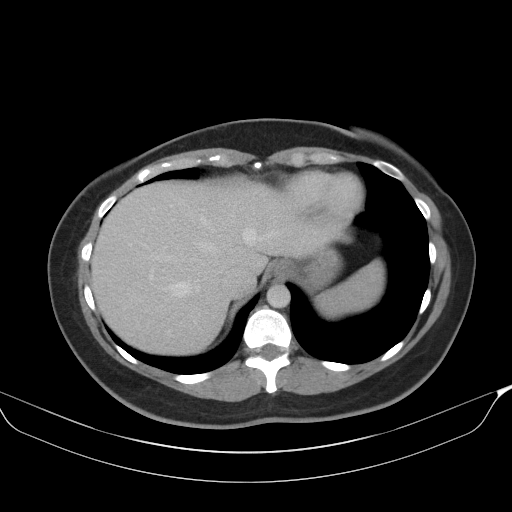
[im 75/90  lung]
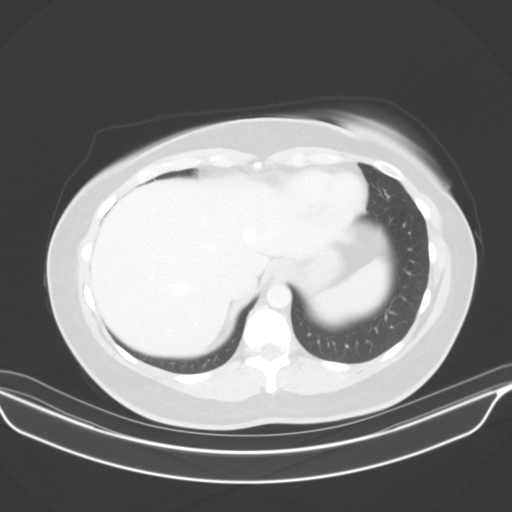
[im 80/90  lung]
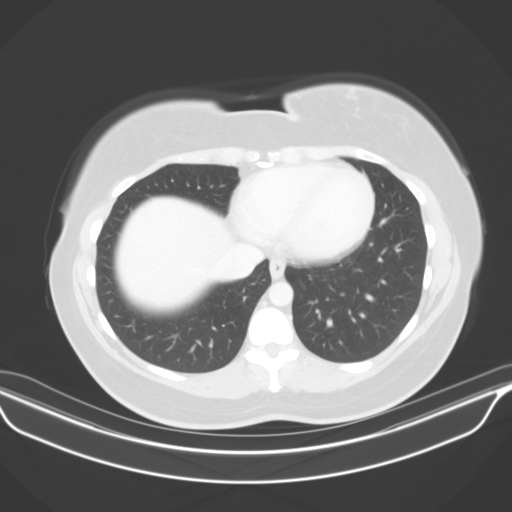
[im 85/90  soft-tissue]
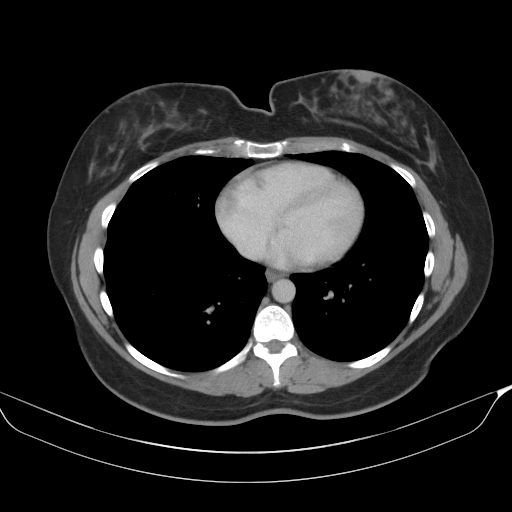
[im 85/90  lung]
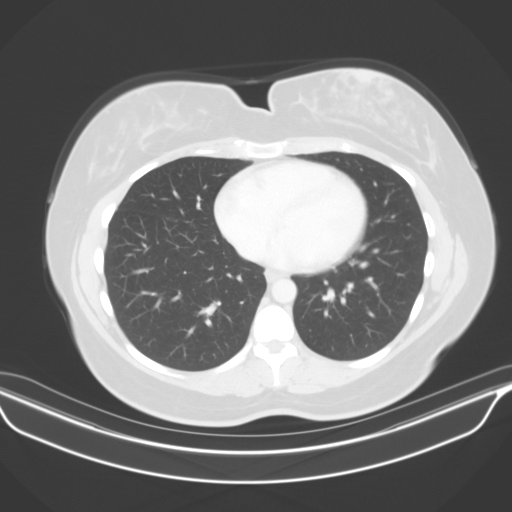

[14 of 32 positions shown; findings below may reference images not displayed]

FINDINGS: Lower chest: Lung bases are clear.

Hepatobiliary: Small 5 mm hypodense lesion in the RIGHT hepatic lobe
is too small to characterize image [DATE]) no biliary duct dilatation.
Common bile duct is normal.

Pancreas: Pancreas is normal. No ductal dilatation. No pancreatic
inflammation.

Spleen: Normal spleen

Adrenals/urinary tract: Adrenal glands normal. Small 3 mm
calcification upper pole of the RIGHT kidney. No ureterolithiasis or
obstructive uropathy. No enhancing renal cortical lesion. Bladder
normal.

Stomach/Bowel: Stomach, small bowel, appendix, and cecum are normal.
The colon and rectosigmoid colon are normal.

Vascular/Lymphatic: Abdominal aorta is normal caliber. No periportal
or retroperitoneal adenopathy. No pelvic adenopathy.

Reproductive: IUD in expected location within the uterus. Dominant
follicle in the LEFT ovary measures 20 mm. Normal size LEFT ovary.
RIGHT ovary is normal size measuring 2.4 by 1.3 cm on image 60/2. No
free fluid the pelvis.

Other: No abdominal hernia.

Musculoskeletal: No aggressive osseous lesion.
IMPRESSION: 1. No explanation for LEFT-sided pain. No diverticulitis. No
ureterolithiasis or obstructive uropathy.
2. Dominant follicle of the LEFT ovary. Ovaries and uterus normal.
IUD expected location.
3. Normal appendix.
4. Small hypodense lesion in the liver too small to characterize but
favored benign.
5. Small nonobstructing calculus in the RIGHT kidney

## 2022-02-04 ENCOUNTER — Other Ambulatory Visit: Payer: Self-pay | Admitting: Internal Medicine

## 2022-02-05 NOTE — Telephone Encounter (Signed)
Called patient and left voice message in regards to a medication request for azithromycin. Patient will need an appointment if she is in need for an appointment. Informed her on voice message that we are unable to refill antibiotics without an appt.  ?

## 2022-06-02 ENCOUNTER — Ambulatory Visit
Admission: EM | Admit: 2022-06-02 | Discharge: 2022-06-02 | Disposition: A | Discharge status: Home / Self Care | Payer: Self-pay

## 2022-06-02 ENCOUNTER — Ambulatory Visit (INDEPENDENT_AMBULATORY_CARE_PROVIDER_SITE_OTHER): Payer: Self-pay

## 2022-06-02 DIAGNOSIS — S73102A Unspecified sprain of left hip, initial encounter: Secondary | ICD-10-CM

## 2022-06-02 DIAGNOSIS — M25552 Pain in left hip: Secondary | ICD-10-CM

## 2022-06-02 DIAGNOSIS — S76212A Strain of adductor muscle, fascia and tendon of left thigh, initial encounter: Secondary | ICD-10-CM

## 2022-06-02 LAB — POCT URINE PREGNANCY: Preg Test, Ur: NEGATIVE

## 2022-06-02 MED ORDER — KETOROLAC TROMETHAMINE 30 MG/ML IJ SOLN
30.0000 mg | Freq: Once | INTRAMUSCULAR | Status: AC
  Administered 2022-06-02: 30 mg via INTRAMUSCULAR

## 2022-06-02 MED ORDER — ACETAMINOPHEN ER 650 MG PO TBCR: 650.0000 mg | EXTENDED_RELEASE_TABLET | Freq: Three times a day (TID) | ORAL | 0 refills | Status: AC | PRN

## 2022-06-02 NOTE — ED Provider Notes (Signed)
[facility]    CSN: [id number] Arrival date & time: 06/02/22  3500      History   Chief Complaint Chief Complaint  Patient presents with   Fall   Hip Pain         HPI Sydney Trujillo is a 31 y.o. female.    Fall  Hip Pain    Patient presents for complaints of left hip pain after fall 1 day ago.  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  She states that she was trying to pull the left leg out of the water hose when she fell.  She is unsure if she fell directly onto the left hip or how she fell, but now presents with pain in the left hip, left buttocks, and left groin region.  Pain worsens with bending over.  She also states that she has pain when she rotates the left foot outward.  She denies inability to bear weight, numbness, tingling, or swelling.  She states the pain in the left hip radiates into the left anterior thigh.  She is taken Aleve for her pain.  Denies any other previous injury.  Past Medical History:  Diagnosis Date   Abdominal pain 09/07/2013   Arthritis    Rhuematoid    Constipation 11/08/2015   High cholesterol    Irregular bleeding 08/30/2013   IUD (intrauterine device) in place 10/18/2013   07/2009 IUD   Lupus ([place])    Pregnant 11/08/2015   Reflux 11/08/2015   Rheumatoid arthritis [facility])     Patient Active Problem List   Diagnosis Date Noted   History of breast lump/mass excision 03/16/2020   Hematuria 05/27/2019   Encounter for IUD insertion 08/25/2016   Atypical squamous cells of undetermined significance (ASCUS) on Papanicolaou smear of cervix 02/07/2016   Constipation 11/08/2015   Reflux 11/08/2015   Abdominal pain 09/07/2013   Irregular bleeding 08/30/2013    Past Surgical History:  Procedure Laterality Date   BREAST BIOPSY  10/17/2011   Procedure: BREAST BIOPSY;  Surgeon: Donato Heinz;  Location: [facility];  Service: General;  Laterality: Left;    OB History      Gravida  1   Para  1   Term  1   Preterm      AB      Living  1      SAB      IAB      Ectopic      Multiple  0   Live Births  1            Home Medications    Prior to Admission medications   Medication Sig Start Date End Date Taking? Authorizing Provider  acetaminophen (TYLENOL 8 HOUR) 650 MG CR tablet Take 1 tablet (650 mg total) by mouth every 8 (eight) hours as needed for pain. 06/02/22  Yes Yecheskel Kurek-Warren, Alda Lea, NP  cyanocobalamin 1000 MCG tablet Take 1,000 mcg by mouth daily.   Yes [provider]  Multiple Vitamin (MULTIVITAMIN ADULT PO) Take by mouth.   Yes [provider]  baricitinib (OLUMIANT) tablet Take 2 mg by mouth daily.    [provider]  busPIRone (BUSPAR) 5 MG tablet Take 5 mg by mouth 2 (two) times daily as needed. 05/27/22   [provider]  celecoxib (CELEBREX) 200 MG capsule Take 200 mg by mouth daily.  Patient not taking: Reported on 08/16/2020  [provider]  cetirizine (ZYRTEC) 10 MG tablet Take 10 mg by mouth daily.    [provider]  fluconazole (DIFLUCAN) 150 MG tablet Take 1 now and 1 in 3 days if needed 01/30/21   Estill Dooms, NP  folic acid (FOLVITE) 1 MG tablet Take 1 mg by mouth daily.    [provider]  hydroxychloroquine (PLAQUENIL) 200 MG tablet TAKE TWO (2) TABLETS BY MOUTH ONCE DAILY. 04/18/19   [provider]  Hyoscyamine Sulfate SL (LEVSIN/SL) 0.125 MG SUBL Place 0.125 mg under the tongue 2 (two) times daily as needed (abd pain). 08/16/20   Ezzard Standing, PA-C  nitrofurantoin, macrocrystal-monohydrate, (MACROBID) 100 MG capsule Take 1 capsule (100 mg total) by mouth 2 (two) times daily. X 7 days 09/17/20   Roma Schanz, CNM  omeprazole (PRILOSEC) 20 MG capsule Take 20 mg by mouth 2 (two) times daily before a meal.  04/18/19   [provider]    Family History Family History  Problem Relation Age of Onset    Hypertension Mother    Diabetes Father    Hypertension Father    Cancer Paternal Grandfather    Diabetes Paternal Grandmother    Hypertension Paternal Grandmother    Fibromyalgia Paternal Grandmother    Anesthesia problems Neg Hx    Hypotension Neg Hx    Malignant hyperthermia Neg Hx    Pseudochol deficiency Neg Hx     Social History Social History   Tobacco Use   Smoking status: Former    Packs/day: 0.50    Years: 1.00    Total pack years: 0.50    Types: Cigarettes    Quit date: 10/12/2009    Years since quitting: 12.6   Smokeless tobacco: Never  Vaping Use   Vaping Use: Never used  Substance Use Topics   Alcohol use: No    Comment: occasionally    Drug use: No     Allergies   Latex   Review of Systems Review of Systems Per HPI  Physical Exam Triage Vital Signs ED Triage Vitals  Enc Vitals Group     BP 06/02/22 1022 (!) 157/93     Pulse Rate 06/02/22 1022 88     Resp 06/02/22 1022 18     Temp 06/02/22 1022 99.1 F (37.3 C)     Temp Source 06/02/22 1022 Oral     SpO2 06/02/22 1022 99 %     Weight --      Height --      Head Circumference --      Peak Flow --      Pain Score 06/02/22 1019 9     Pain Loc --      Pain Edu? --      Excl. in [place]? --    No data found.  Updated Vital Signs BP (!) 157/93 (BP Location: Right Arm)   Pulse 88   Temp 99.1 F (37.3 C) (Oral)   Resp 18   LMP  (Within Months) Comment: 1 month  SpO2 99%   Visual Acuity Right Eye Distance:   Left Eye Distance:   Bilateral Distance:    Right Eye Near:   Left Eye Near:    Bilateral Near:     Physical Exam Vitals and nursing note reviewed.  Constitutional:      General: She is not in acute distress.    Appearance: Normal appearance.  HENT:     Head: Normocephalic.  Eyes:  Extraocular Movements: Extraocular movements intact.     Pupils: Pupils are equal, round, and reactive to light.  Cardiovascular:     Rate and Rhythm: Normal rate and regular rhythm.   Pulmonary:     Effort: Pulmonary effort is normal.     Breath sounds: Normal breath sounds.  Abdominal:     General: Bowel sounds are normal.     Palpations: Abdomen is soft.     Tenderness: There is no right CVA tenderness, left CVA tenderness or guarding.  Musculoskeletal:     Cervical back: Normal range of motion.     Left hip: Tenderness present. No deformity or crepitus. Decreased range of motion. Normal strength.     Left lower leg: Tenderness present. No swelling, deformity or bony tenderness. No edema.     Comments: Tenderness to the left gluteal maximus and left trochanter. No ecchymosis, swelling, deformity noted. Tenderness to the left groin region.   Skin:    General: Skin is warm and dry.  Neurological:     General: No focal deficit present.     Mental Status: She is alert and oriented to person, place, and time.  Psychiatric:        Mood and Affect: Mood normal.        Behavior: Behavior normal.      UC Treatments / Results  Labs (all labs ordered are listed, but only abnormal results are displayed) Labs Reviewed  POCT URINE PREGNANCY    EKG   Radiology DG Hip Unilat With Pelvis 2-3 Views Left  Result Date: 06/02/2022 CLINICAL DATA:  Status post fall with left hip pain EXAM: DG HIP (WITH OR WITHOUT PELVIS) 2-3V LEFT COMPARISON:  CT abdomen pelvis July 19, 2020 FINDINGS: There is no evidence of hip fracture or dislocation. Focal sclerosis is identified in the left femoral neck unchanged compared to prior CT of July 19, 2020. IMPRESSION: No acute fracture or dislocation. 1.7 cm focal sclerosis is identified in the left femoral neck unchanged compared to prior CT of July 19, 2020. Electronically Signed   By: Abelardo Diesel M.D.   On: 06/02/2022 10:54    Procedures Procedures (including critical care time)  Medications Ordered in UC Medications  ketorolac (TORADOL) 30 MG/ML injection 30 mg (30 mg Intramuscular Given 06/02/22 1057)    Initial Impression /  Assessment and Plan / UC Course  I have reviewed the triage vital signs and the nursing notes.  Pertinent labs & imaging results that were available during my care of the patient were reviewed by me and considered in my medical decision making (see chart for details).  Patient presents for complaints of left hip pain after fall 1 day ago.  Based on her mechanism of injury, symptoms are consistent with a hip sprain and strain of the left groin.  Toradol injection was given in the clinic today.  X-ray was performed which is negative for hip fracture or pelvic fracture.  Tylenol arthritis strength 650 mg was prescribed to the patient.  Supportive care recommendations were provided to the patient.  Patient advised to follow-up with her primary care if symptoms do not improve. Final Clinical Impressions(s) / UC Diagnoses   Final diagnoses:  Hip sprain, left, initial encounter  Strain of groin, left, initial encounter     Discharge Instructions      Your x-rays are negative for fracture or dislocation.  Symptoms are consistent with a sprain of the left hip and of the left groin. You have been  given Toradol in the office today.  Do not take any more Advil today. May resume Tylenol arthritis strength 650 mg tablets every 8 hours to help with pain. Apply ice to the affected area, apply for 20 minutes, remove for 1 hour, then repeat.  Apply as needed.  May also apply heat to the affected area in the same pattern. Gentle stretching and range of motion exercises. Follow-up if your symptoms worsen or do not improve.     ED Prescriptions     Medication Sig Dispense Auth. Provider   acetaminophen (TYLENOL 8 HOUR) 650 MG CR tablet Take 1 tablet (650 mg total) by mouth every 8 (eight) hours as needed for pain. 30 tablet Iisha Soyars-Warren, Alda Lea, NP      PDMP not reviewed this encounter.   Tish Men, NP 06/02/22 1125

## 2022-06-02 NOTE — Discharge Instructions (Signed)
Your x-rays are negative for fracture or dislocation.  Symptoms are consistent with a sprain of the left hip and of the left groin. You have been given Toradol in the office today.  Do not take any more Advil today. May resume Tylenol arthritis strength 650 mg tablets every 8 hours to help with pain. Apply ice to the affected area, apply for 20 minutes, remove for 1 hour, then repeat.  Apply as needed.  May also apply heat to the affected area in the same pattern. Gentle stretching and range of motion exercises. Follow-up if your symptoms worsen or do not improve.

## 2022-06-02 NOTE — ED Triage Notes (Signed)
Pt report left hip pain x 1 day. Pain is worse when she rotate the left foot pr bed over. 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 Aleve gives some relief.

## 2022-12-30 ENCOUNTER — Ambulatory Visit
Admission: EM | Admit: 2022-12-30 | Discharge: 2022-12-30 | Disposition: A | Discharge status: Home / Self Care | Payer: Self-pay | Attending: Nurse Practitioner | Admitting: Nurse Practitioner

## 2022-12-30 ENCOUNTER — Encounter: Payer: Self-pay | Admitting: Emergency Medicine

## 2022-12-30 DIAGNOSIS — S61012A Laceration without foreign body of left thumb without damage to nail, initial encounter: Secondary | ICD-10-CM

## 2022-12-30 DIAGNOSIS — Z23 Encounter for immunization: Secondary | ICD-10-CM

## 2022-12-30 MED ORDER — MUPIROCIN 2 % EX OINT: 1.0000 | TOPICAL_OINTMENT | Freq: Two times a day (BID) | CUTANEOUS | 0 refills | Status: AC

## 2022-12-30 MED ORDER — CEPHALEXIN 500 MG PO CAPS: 500.0000 mg | ORAL_CAPSULE | Freq: Four times a day (QID) | ORAL | 0 refills | Status: AC

## 2022-12-30 MED ORDER — TETANUS-DIPHTH-ACELL PERTUSSIS 5-2.5-18.5 LF-MCG/0.5 IM SUSY: 0.5000 mL | PREFILLED_SYRINGE | Freq: Once | INTRAMUSCULAR | Status: DC

## 2022-12-30 MED ORDER — TETANUS-DIPHTH-ACELL PERTUSSIS 5-2.5-18.5 LF-MCG/0.5 IM SUSY
0.5000 mL | PREFILLED_SYRINGE | Freq: Once | INTRAMUSCULAR | Status: AC
  Administered 2022-12-30: 0.5 mL via INTRAMUSCULAR

## 2022-12-30 NOTE — Discharge Instructions (Addendum)
We put 4 sutures in your left thumb today.  Please keep the first bandage on for 24 hours.  After that, please remove the bandage and clean the area twice daily.  Then, apply a thin layer of the bactroban ointment and a nonadherent bandage.  Keep the area covered when at work.    Please start taking the Keflex to prevent infection.    We have updated your tetanus shot today.   Please return in 10-14 days to have the sutures removed.

## 2022-12-30 NOTE — ED Provider Notes (Addendum)
[facility]    CSN: [id number] Arrival date & time: 12/30/22  0847      History   Chief Complaint No chief complaint on file.   HPI Sydney Trujillo is a 32 y.o. female.   Patient presents today with laceration to left thumb that she sustained earlier today while trying to cut a zip tie with scissors.  Reports she came to urgent care immediately after cutting herself.  No decreased range of motion in the thumb or hand or numbness or tingling in her fingertips since the injury.  Reports last tetanus shot was more than 5 years ago.    Past Medical History:  Diagnosis Date   Abdominal pain 09/07/2013   Arthritis    Rhuematoid    Constipation 11/08/2015   High cholesterol    Irregular bleeding 08/30/2013   IUD (intrauterine device) in place 10/18/2013   07/2009 IUD   Lupus ([place])    Pregnant 11/08/2015   Reflux 11/08/2015   Rheumatoid arthritis [facility])     Patient Active Problem List   Diagnosis Date Noted   History of breast lump/mass excision 03/16/2020   Hematuria 05/27/2019   Encounter for IUD insertion 08/25/2016   Atypical squamous cells of undetermined significance (ASCUS) on Papanicolaou smear of cervix 02/07/2016   Constipation 11/08/2015   Reflux 11/08/2015   Abdominal pain 09/07/2013   Irregular bleeding 08/30/2013    Past Surgical History:  Procedure Laterality Date   BREAST BIOPSY  10/17/2011   Procedure: BREAST BIOPSY;  Surgeon: Donato Heinz;  Location: [facility];  Service: General;  Laterality: Left;    OB History     Gravida  1   Para  1   Term  1   Preterm      AB      Living  1      SAB      IAB      Ectopic      Multiple  0   Live Births  1            Home Medications    Prior to Admission medications   Medication Sig Start Date End Date Taking? Authorizing Provider  cephALEXin (KEFLEX) 500 MG capsule Take 1 capsule (500 mg total) by mouth 4 (four) times daily. 12/30/22  Yes Eulogio Bear, NP  mupirocin  ointment (BACTROBAN) 2 % Apply 1 Application topically 2 (two) times daily for 10 days. 12/30/22 01/09/23 Yes Eulogio Bear, NP  acetaminophen (TYLENOL 8 HOUR) 650 MG CR tablet Take 1 tablet (650 mg total) by mouth every 8 (eight) hours as needed for pain. 06/02/22   Leath-Warren, Alda Lea, NP  baricitinib (OLUMIANT) tablet Take 2 mg by mouth daily.    [provider]  busPIRone (BUSPAR) 5 MG tablet Take 5 mg by mouth 2 (two) times daily as needed. 05/27/22   [provider]  celecoxib (CELEBREX) 200 MG capsule Take 200 mg by mouth daily.  Patient not taking: Reported on 08/16/2020    [provider]  cetirizine (ZYRTEC) 10 MG tablet Take 10 mg by mouth daily.    [provider]  cyanocobalamin 1000 MCG tablet Take 1,000 mcg by mouth daily.    [provider]  fluconazole (DIFLUCAN) 150 MG tablet Take 1 now and 1 in 3 days if needed 01/30/21   Estill Dooms, NP  folic acid (FOLVITE) 1 MG tablet Take 1 mg by mouth daily.    [provider]  hydroxychloroquine (PLAQUENIL) 200 MG tablet TAKE TWO (2) TABLETS BY MOUTH ONCE DAILY. 04/18/19   [provider]  Hyoscyamine Sulfate SL (LEVSIN/SL) 0.125 MG SUBL Place 0.125 mg under the tongue 2 (two) times daily as needed (abd pain). 08/16/20   Ezzard Standing, PA-C  Multiple Vitamin (MULTIVITAMIN ADULT PO) Take by mouth.    [provider]  nitrofurantoin, macrocrystal-monohydrate, (MACROBID) 100 MG capsule Take 1 capsule (100 mg total) by mouth 2 (two) times daily. X 7 days 09/17/20   Roma Schanz, CNM  omeprazole (PRILOSEC) 20 MG capsule Take 20 mg by mouth 2 (two) times daily before a meal.  04/18/19   [provider]    Family History Family History  Problem Relation Age of Onset   Hypertension Mother    Diabetes Father    Hypertension Father    Cancer Paternal Grandfather    Diabetes Paternal Grandmother    Hypertension Paternal Grandmother    Fibromyalgia  Paternal Grandmother    Anesthesia problems Neg Hx    Hypotension Neg Hx    Malignant hyperthermia Neg Hx    Pseudochol deficiency Neg Hx     Social History Social History   Tobacco Use   Smoking status: Former    Packs/day: 0.50    Years: 1.00    Total pack years: 0.50    Types: Cigarettes    Quit date: 10/12/2009    Years since quitting: 13.2   Smokeless tobacco: Never  Vaping Use   Vaping Use: Never used  Substance Use Topics   Alcohol use: No    Comment: occasionally    Drug use: No     Allergies   Latex   Review of Systems Review of Systems Per HPI  Physical Exam Triage Vital Signs ED Triage Vitals  Enc Vitals Group     BP 12/30/22 0851 138/86     Pulse Rate 12/30/22 0851 84     Resp 12/30/22 0851 18     Temp 12/30/22 0851 98.1 F (36.7 C)     Temp Source 12/30/22 0851 Oral     SpO2 12/30/22 0851 98 %     Weight --      Height --      Head Circumference --      Peak Flow --      Pain Score 12/30/22 0853 2     Pain Loc --      Pain Edu? --      Excl. in [place]? --    No data found.  Updated Vital Signs BP 138/86 (BP Location: Right Arm)   Pulse 84   Temp 98.1 F (36.7 C) (Oral)   Resp 18   LMP 12/07/2022 (Approximate)   SpO2 98%   Visual Acuity Right Eye Distance:   Left Eye Distance:   Bilateral Distance:    Right Eye Near:   Left Eye Near:    Bilateral Near:     Physical Exam Vitals and nursing note reviewed.  Constitutional:      General: She is not in acute distress.    Appearance: Normal appearance. She is not toxic-appearing.  HENT:     Mouth/Throat:     Mouth: Mucous membranes are moist.     Pharynx: Oropharynx is clear.  Pulmonary:     Effort: Pulmonary effort is normal. No respiratory distress.  Musculoskeletal:       Hands:     Comments: Approximately 4 cm laceration to left thumb in approximately area  marked; patient has full range of motion of the digit.  She does endorse a little bit of numbness in the proximal  digit that began after soaking her hand in Hibiclens solution.  Pulses are equal bilaterally.  Capillary refill is equal in all digits.  Skin:    General: Skin is warm and dry.     Capillary Refill: Capillary refill takes less than 2 seconds.     Findings: Laceration present.  Neurological:     Mental Status: She is alert and oriented to person, place, and time.  Psychiatric:        Behavior: Behavior is cooperative.      UC Treatments / Results  Labs (all labs ordered are listed, but only abnormal results are displayed) Labs Reviewed - No data to display  EKG   Radiology No results found.  Procedures Laceration Repair  Date/Time: 12/30/2022 5:06 PM  Performed by: Eulogio Bear, NP Authorized by: Eulogio Bear, NP   Consent:    Consent obtained:  Verbal   Consent given by:  Patient   Risks, benefits, and alternatives were discussed: yes     Risks discussed:  Infection, pain, nerve damage, poor cosmetic result and need for additional repair   Alternatives discussed:  Delayed treatment and referral Universal protocol:    Procedure explained and questions answered to patient or proxy's satisfaction: yes     Patient identity confirmed:  Verbally with patient Anesthesia:    Anesthesia method:  Local infiltration   Local anesthetic:  Lidocaine 2% w/o epi Laceration details:    Location:  Finger   Finger location:  L thumb   Length (cm):  4 Pre-procedure details:    Preparation:  Patient was prepped and draped in usual sterile fashion Exploration:    Limited defect created (wound extended): no     Hemostasis achieved with:  Direct pressure   Wound exploration: wound explored through full range of motion and entire depth of wound visualized     Contaminated: no   Treatment:    Wound cleansed with: Hibiclens.   Amount of cleaning:  Standard Skin repair:    Repair method:  Sutures   Suture size:  4-0   Suture material:  Nylon   Suture technique:  Simple  interrupted   Number of sutures:  4 Approximation:    Approximation:  Close Repair type:    Repair type:  Simple Post-procedure details:    Dressing:  Non-adherent dressing and antibiotic ointment   Procedure completion:  Tolerated well, no immediate complications  (including critical care time)  Medications Ordered in UC Medications  Tdap (BOOSTRIX) injection 0.5 mL (0.5 mLs Intramuscular Given 12/30/22 0951)    Initial Impression / Assessment and Plan / UC Course  I have reviewed the triage vital signs and the nursing notes.  Pertinent labs & imaging results that were available during my care of the patient were reviewed by me and considered in my medical decision making (see chart for details).   Patient is well-appearing, normotensive, afebrile, not tachycardic, not tachypneic, oxygenating well on room air.    1. Laceration of left thumb without damage to nail, initial encounter Laceration repair as above Recommended follow-up with hand specialist if numbness persists Start Keflex to cover for infection given location Wound care discussed-start Bactroban Recommended twice daily cleaning/dressing changes Return if signs or symptoms of infection develop Tdap  updated Return in 10 to 14 days for suture removal  The patient was given the opportunity  to ask questions.  All questions answered to their satisfaction.  The patient is in agreement to this plan.    Final Clinical Impressions(s) / UC Diagnoses   Final diagnoses:  Laceration of left thumb without damage to nail, initial encounter     Discharge Instructions      We put 4 sutures in your left thumb today.  Please keep the first bandage on for 24 hours.  After that, please remove the bandage and clean the area twice daily.  Then, apply a thin layer of the bactroban ointment and a nonadherent bandage.  Keep the area covered when at work.    Please start taking the Keflex to prevent infection.    We have updated your  tetanus shot today.   Please return in 10-14 days to have the sutures removed.     ED Prescriptions     Medication Sig Dispense Auth. Provider   cephALEXin (KEFLEX) 500 MG capsule Take 1 capsule (500 mg total) by mouth 4 (four) times daily. 20 capsule Noemi Chapel A, NP   mupirocin ointment (BACTROBAN) 2 % Apply 1 Application topically 2 (two) times daily for 10 days. 22 g Eulogio Bear, NP      PDMP not reviewed this encounter.   Eulogio Bear, NP 12/30/22 1709    Eulogio Bear, NP 12/30/22 1710

## 2022-12-30 NOTE — ED Triage Notes (Signed)
Cut left thumb this morning while trying to cut a zip tie with scissors.    Tetanus within the last 5 years.

## 2023-03-26 ENCOUNTER — Ambulatory Visit: Payer: Self-pay

## 2023-04-02 ENCOUNTER — Other Ambulatory Visit (HOSPITAL_COMMUNITY): Payer: Self-pay | Admitting: Internal Medicine

## 2023-04-02 DIAGNOSIS — Z87442 Personal history of urinary calculi: Secondary | ICD-10-CM

## 2023-04-03 ENCOUNTER — Ambulatory Visit (HOSPITAL_COMMUNITY)
Admission: RE | Admit: 2023-04-03 | Discharge: 2023-04-03 | Disposition: A | Discharge status: Home / Self Care | Payer: Self-pay | Source: Ambulatory Visit | Attending: Internal Medicine | Admitting: Internal Medicine

## 2023-04-03 DIAGNOSIS — Z87442 Personal history of urinary calculi: Secondary | ICD-10-CM | POA: Insufficient documentation

## 2023-11-02 ENCOUNTER — Telehealth: Payer: Self-pay | Admitting: Adult Health

## 2023-11-02 MED ORDER — FLUCONAZOLE 150 MG PO TABS: ORAL_TABLET | ORAL | 1 refills | Status: AC

## 2023-11-02 NOTE — Telephone Encounter (Signed)
Has been on antibiotic and requests diflucan, rx sent

## 2024-04-21 ENCOUNTER — Emergency Department (HOSPITAL_BASED_OUTPATIENT_CLINIC_OR_DEPARTMENT_OTHER)
Admission: EM | Admit: 2024-04-21 | Discharge: 2024-04-21 | Disposition: A | Discharge status: Home / Self Care | Payer: Self-pay | Attending: Emergency Medicine | Admitting: Emergency Medicine

## 2024-04-21 ENCOUNTER — Encounter (HOSPITAL_BASED_OUTPATIENT_CLINIC_OR_DEPARTMENT_OTHER): Payer: Self-pay

## 2024-04-21 ENCOUNTER — Ambulatory Visit
Admission: EM | Admit: 2024-04-21 | Discharge: 2024-04-21 | Disposition: A | Discharge status: Home / Self Care | Payer: Self-pay

## 2024-04-21 ENCOUNTER — Other Ambulatory Visit: Payer: Self-pay

## 2024-04-21 ENCOUNTER — Emergency Department (HOSPITAL_BASED_OUTPATIENT_CLINIC_OR_DEPARTMENT_OTHER): Payer: Self-pay | Admitting: Radiology

## 2024-04-21 DIAGNOSIS — S61213A Laceration without foreign body of left middle finger without damage to nail, initial encounter: Secondary | ICD-10-CM | POA: Insufficient documentation

## 2024-04-21 DIAGNOSIS — W293XXA Contact with powered garden and outdoor hand tools and machinery, initial encounter: Secondary | ICD-10-CM | POA: Insufficient documentation

## 2024-04-21 MED ORDER — LIDOCAINE HCL (PF) 1 % IJ SOLN
10.0000 mL | Freq: Once | INTRAMUSCULAR | Status: AC
  Administered 2024-04-21: 10 mL
  Filled 2024-04-21: qty 10

## 2024-04-21 NOTE — ED Triage Notes (Signed)
 Pt states she cut her left middle finger today with a hedge trimmer.

## 2024-04-21 NOTE — ED Notes (Signed)
 Lidocaine  at bedside for [facility], [place]

## 2024-04-21 NOTE — ED Provider Notes (Signed)
 [facility]    CSN: [id number] Arrival date & time: 04/21/24  1136      History   Chief Complaint Chief Complaint  Patient presents with   Laceration    HPI Sydney Trujillo is a 33 y.o. female.   Patient presents today with laceration to left middle finger.  Reports about half an hour prior to arrival in urgent care, she was trimming hedges when the hedge tremor fell and she caught it with her hand, cutting her finger.  She reports pain to the area and is concerned the cut is down to the bone.  The wound is actively bleeding.  No decreased range of motion or change in color of the finger.    Past Medical History:  Diagnosis Date   Abdominal pain 09/07/2013   Arthritis    Rhuematoid    Constipation 11/08/2015   High cholesterol    Irregular bleeding 08/30/2013   IUD (intrauterine device) in place 10/18/2013   07/2009 IUD   Lupus    Pregnant 11/08/2015   Reflux 11/08/2015   Rheumatoid arthritis [facility])     Patient Active Problem List   Diagnosis Date Noted   History of breast lump/mass excision 03/16/2020   Hematuria 05/27/2019   Encounter for IUD insertion 08/25/2016   Atypical squamous cells of undetermined significance (ASCUS) on Papanicolaou smear of cervix 02/07/2016   Constipation 11/08/2015   Reflux 11/08/2015   Abdominal pain 09/07/2013   Irregular bleeding 08/30/2013    Past Surgical History:  Procedure Laterality Date   BREAST BIOPSY  10/17/2011   Procedure: BREAST BIOPSY;  Surgeon: Brent C Ziegler;  Location: [facility];  Service: General;  Laterality: Left;    OB History     Gravida  1   Para  1   Term  1   Preterm      AB      Living  1      SAB      IAB      Ectopic      Multiple  0   Live Births  1            Home Medications    Prior to Admission medications   Medication Sig Start Date End Date Taking? Authorizing Provider  cetirizine (ZYRTEC) 10 MG tablet Take 10 mg by mouth daily.   Yes [provider]  NAPROSYN  500 MG tablet 1 tablet with food or milk as needed Orally every 12 hrs for 30 days 03/10/24  Yes [provider]  predniSONE (STERAPRED UNI-PAK 48 TAB) 5 MG (48) TBPK tablet Take by mouth as directed.   Yes [provider]  Tofacitinib Citrate ER (XELJANZ XR) 11 MG TB24 1 tablet Orally Once a day 03/10/24  Yes [provider]  acetaminophen  (TYLENOL  8 HOUR) 650 MG CR tablet Take 1 tablet (650 mg total) by mouth every 8 (eight) hours as needed for pain. 06/02/22   Leath-Warren, Belen Bowers, NP  baricitinib (OLUMIANT) tablet Take 2 mg by mouth daily.    [provider]  busPIRone (BUSPAR) 5 MG tablet Take 5 mg by mouth 2 (two) times daily as needed. 05/27/22   [provider]  celecoxib  (CELEBREX ) 200 MG capsule Take 200 mg by mouth daily.  Patient not taking: Reported on 08/16/2020    [provider]  cephALEXin  (KEFLEX ) 500 MG capsule Take 1 capsule (500 mg total) by mouth 4 (four) times daily. 12/30/22   Wilhemena Harbour,  NP  cyanocobalamin 1000 MCG tablet Take 1,000 mcg by mouth daily.    [provider]  fluconazole  (DIFLUCAN ) 150 MG tablet Take 1 now and 1 in 3 days if needed 11/02/23   Javan Messing, NP  folic acid (FOLVITE) 1 MG tablet Take 1 mg by mouth daily.    [provider]  hydroxychloroquine (PLAQUENIL) 200 MG tablet TAKE TWO (2) TABLETS BY MOUTH ONCE DAILY. 04/18/19   [provider]  Hyoscyamine  Sulfate SL (LEVSIN/SL) 0.125 MG SUBL Place 0.125 mg under the tongue 2 (two) times daily as needed (abd pain). 08/16/20   Claven Cumming, PA-C  Multiple Vitamin (MULTIVITAMIN ADULT PO) Take by mouth.    [provider]  nitrofurantoin , macrocrystal-monohydrate, (MACROBID ) 100 MG capsule Take 1 capsule (100 mg total) by mouth 2 (two) times daily. X 7 days 09/17/20   Ferd Householder, CNM  omeprazole (PRILOSEC) 20 MG capsule Take 20 mg by mouth 2 (two) times daily before a meal.  04/18/19    [provider]    Family History Family History  Problem Relation Age of Onset   Hypertension Mother    Diabetes Father    Hypertension Father    Cancer Paternal Grandfather    Diabetes Paternal Grandmother    Hypertension Paternal Grandmother    Fibromyalgia Paternal Grandmother    Anesthesia problems Neg Hx    Hypotension Neg Hx    Malignant hyperthermia Neg Hx    Pseudochol deficiency Neg Hx     Social History Social History   Tobacco Use   Smoking status: Former    Current packs/day: 0.00    Average packs/day: 0.5 packs/day for 1 year (0.5 ttl pk-yrs)    Types: Cigarettes    Start date: 10/12/2008    Quit date: 10/12/2009    Years since quitting: 14.5   Smokeless tobacco: Never  Vaping Use   Vaping status: Never Used  Substance Use Topics   Alcohol use: No    Comment: occasionally    Drug use: No     Allergies   Latex   Review of Systems Review of Systems Per HPI  Physical Exam Triage Vital Signs ED Triage Vitals  Encounter Vitals Group     BP 04/21/24 1200 (!) 145/86     Systolic BP Percentile --      Diastolic BP Percentile --      Pulse Rate 04/21/24 1200 84     Resp 04/21/24 1200 16     Temp 04/21/24 1200 98.5 F (36.9 C)     Temp Source 04/21/24 1200 Oral     SpO2 04/21/24 1200 98 %     Weight --      Height --      Head Circumference --      Peak Flow --      Pain Score 04/21/24 1201 7     Pain Loc --      Pain Education --      Exclude from Growth Chart --    No data found.  Updated Vital Signs BP (!) 145/86 (BP Location: Right Arm)   Pulse 84   Temp 98.5 F (36.9 C) (Oral)   Resp 16   LMP 04/15/2024 (Exact Date)   SpO2 98%   Visual Acuity Right Eye Distance:   Left Eye Distance:   Bilateral Distance:    Right Eye Near:   Left Eye Near:    Bilateral Near:     Physical Exam  Vitals and nursing note reviewed.  Constitutional:      General: She is not in acute distress.    Appearance: Normal appearance. She  is not toxic-appearing.  HENT:     Mouth/Throat:     Mouth: Mucous membranes are moist.     Pharynx: Oropharynx is clear.  Pulmonary:     Effort: Pulmonary effort is normal. No respiratory distress.  Skin:    General: Skin is warm and dry.     Capillary Refill: Capillary refill takes less than 2 seconds.     Findings: Laceration present.     Comments: Inspection: U-shaped laceration to anterior left third digit over the distal phalanx; no obvious deformity of the digit Palpation: no obvious deformities palpated ROM: Full ROM to left third digit Neurovascular: neurovascularly intact in distal left third digit; the lacerated skin/avulsion is slightly dusky; capillary refill of digit is less than 3 seconds  Neurological:     Mental Status: She is alert and oriented to person, place, and time.  Psychiatric:        Behavior: Behavior is cooperative.      UC Treatments / Results  Labs (all labs ordered are listed, but only abnormal results are displayed) Labs Reviewed - No data to display  EKG   Radiology No results found.  Procedures Procedures (including critical care time)  Medications Ordered in UC Medications - No data to display  Initial Impression / Assessment and Plan / UC Course  I have reviewed the triage vital signs and the nursing notes.  Pertinent labs & imaging results that were available during my care of the patient were reviewed by me and considered in my medical decision making (see chart for details).   Patient is well-appearing, normotensive, afebrile, not tachycardic, not tachypneic, oxygenating well on room air.   1. Laceration of left middle finger without damage to nail, foreign body presence unspecified, initial encounter Avulsion injury to left middle finger Recommended cleaning with chlorhexidine, pressure wash with normal saline, numbing the area and placing sutures for wound closure, and updating Tdap as indicated Patient elects to seek care  for wound closure at either [facility] or ER as she is concerned for depth of the wound and extent of injury Vital signs are stable and patient is safe to transport via private vehicle  The patient was given the opportunity to ask questions.  All questions answered to their satisfaction.  The patient is in agreement to this plan.   Final Clinical Impressions(s) / UC Diagnoses   Final diagnoses:  Laceration of left middle finger without damage to nail, foreign body presence unspecified, initial encounter   Discharge Instructions   None    ED Prescriptions   None    PDMP not reviewed this encounter.   Wilhemena Harbour, NP 04/21/24 [id number]

## 2024-04-21 NOTE — ED Notes (Signed)
 Patient is being discharged from the Urgent Care and sent to the Emergency Department via pmv . Per provider Arlice Bene., patient is in need of higher level of care due to laceration, needing further treatment. Patient is aware and verbalizes understanding of plan of care.  Vitals:   04/21/24 1200  BP: (!) 145/86  Pulse: 84  Resp: 16  Temp: 98.5 F (36.9 C)  SpO2: 98%

## 2024-04-21 NOTE — Discharge Instructions (Signed)
 You received sutures which are dissolvable. I would place a bandage on the affected area and change it at least once daily, or more frequently if it becomes soiled. When changing bandage, clean the wound thoroughly with soap and water, and place a thin layer of Polysporin or Neosporin directly over top of the wound before replacing bandage. Please monitor for any signs of developing infection including worsening redness, pain, pus draining from the affected site.  If you are concerned about any developing infection I recommend that you return for further evaluation for management.

## 2024-04-21 NOTE — ED Provider Notes (Signed)
 [facility] Provider Note   CSN: [id number] Arrival date & time: 04/21/24  1338     History  Chief Complaint  Patient presents with   Laceration    L middle finger    Sydney Trujillo is a 33 y.o. female with overall noncontributory past medical history, up-to-date on tetanus who presents with left middle finger laceration of the distal finger pad using hedge tremors.  Urgent care reported they could not handle it at urgent care and sent the patient to the ED for further evaluation.  Bleeding has subsided at time of ED evaluation.   Laceration      Home Medications Prior to Admission medications   Medication Sig Start Date End Date Taking? Authorizing Provider  acetaminophen  (TYLENOL  8 HOUR) 650 MG CR tablet Take 1 tablet (650 mg total) by mouth every 8 (eight) hours as needed for pain. 06/02/22   Leath-Warren, Belen Bowers, NP  baricitinib (OLUMIANT) tablet Take 2 mg by mouth daily.    [provider]  busPIRone (BUSPAR) 5 MG tablet Take 5 mg by mouth 2 (two) times daily as needed. 05/27/22   [provider]  celecoxib  (CELEBREX ) 200 MG capsule Take 200 mg by mouth daily.  Patient not taking: Reported on 08/16/2020    [provider]  cephALEXin  (KEFLEX ) 500 MG capsule Take 1 capsule (500 mg total) by mouth 4 (four) times daily. 12/30/22   Wilhemena Harbour, NP  cetirizine (ZYRTEC) 10 MG tablet Take 10 mg by mouth daily.    [provider]  cyanocobalamin 1000 MCG tablet Take 1,000 mcg by mouth daily.    [provider]  fluconazole  (DIFLUCAN ) 150 MG tablet Take 1 now and 1 in 3 days if needed 11/02/23   Javan Messing, NP  folic acid (FOLVITE) 1 MG tablet Take 1 mg by mouth daily.    [provider]  hydroxychloroquine (PLAQUENIL) 200 MG tablet TAKE TWO (2) TABLETS BY MOUTH ONCE DAILY. 04/18/19   [provider]  Hyoscyamine  Sulfate SL (LEVSIN/SL) 0.125 MG SUBL Place 0.125  mg under the tongue 2 (two) times daily as needed (abd pain). 08/16/20   Claven Cumming, PA-C  Multiple Vitamin (MULTIVITAMIN ADULT PO) Take by mouth.    [provider]  NAPROSYN  500 MG tablet 1 tablet with food or milk as needed Orally every 12 hrs for 30 days 03/10/24   [provider]  nitrofurantoin , macrocrystal-monohydrate, (MACROBID ) 100 MG capsule Take 1 capsule (100 mg total) by mouth 2 (two) times daily. X 7 days 09/17/20   Ferd Householder, CNM  omeprazole (PRILOSEC) 20 MG capsule Take 20 mg by mouth 2 (two) times daily before a meal.  04/18/19   [provider]  predniSONE (STERAPRED UNI-PAK 48 TAB) 5 MG (48) TBPK tablet Take by mouth as directed.    [provider]  Tofacitinib Citrate ER (XELJANZ XR) 11 MG TB24 1 tablet Orally Once a day 03/10/24   [provider]      Allergies    Latex    Review of Systems   Review of Systems  All other systems reviewed and are negative.   Physical Exam Updated Vital Signs BP (!) 151/99 (BP Location: Right Arm)   Pulse 81   Temp 98.6 F (37 C)   Resp 17   LMP 04/15/2024 (Exact Date)   SpO2 100%  Physical Exam Vitals and nursing note reviewed.  Constitutional:  General: She is not in acute distress.    Appearance: Normal appearance.  HENT:     Head: Normocephalic and atraumatic.  Eyes:     General:        Right eye: No discharge.        Left eye: No discharge.  Cardiovascular:     Rate and Rhythm: Normal rate and regular rhythm.  Pulmonary:     Effort: Pulmonary effort is normal. No respiratory distress.  Musculoskeletal:        General: No deformity.     Comments: With intact flexion, extension of the affected left middle finger all phalanx segments.  No evidence of tendon rupture.  Skin:    General: Skin is warm and dry.     Comments: Laceration distal volar aspect of left middle finger, no active bleeding, no evidence of laceration to tendon, there is some darkening of  some of the edges but most of the wound has appropriate appearance of wound bed, appropriately vascularized.  No foreign body noted.  Neurological:     Mental Status: She is alert and oriented to person, place, and time.  Psychiatric:        Mood and Affect: Mood normal.        Behavior: Behavior normal.     ED Results / Procedures / Treatments   Labs (all labs ordered are listed, but only abnormal results are displayed) Labs Reviewed - No data to display  EKG None  Radiology No results found.  Procedures .Laceration Repair  Date/Time: 04/21/2024 3:13 PM  Performed by: Nelly Banco, PA-C Authorized by: Nelly Banco, PA-C   Consent:    Consent obtained:  Verbal   Consent given by:  Patient   Risks, benefits, and alternatives were discussed: yes     Risks discussed:  Infection, pain, poor cosmetic result, tendon damage, vascular damage, poor wound healing, need for additional repair and nerve damage   Alternatives discussed:  No treatment Universal protocol:    Procedure explained and questions answered to patient or proxy's satisfaction: yes     Patient identity confirmed:  Verbally with patient Anesthesia:    Anesthesia method:  Local infiltration   Local anesthetic:  Lidocaine  1% w/o epi Laceration details:    Location:  Finger   Finger location:  L long finger   Length (cm):  3.5   Depth (mm):  4 Treatment:    Area cleansed with:  Shur-Clens   Amount of cleaning:  Standard Skin repair:    Repair method:  Sutures   Suture size:  4-0   Suture material:  Chromic gut   Suture technique:  Simple interrupted   Number of sutures:  6 Approximation:    Approximation:  Close Repair type:    Repair type:  Simple Post-procedure details:    Dressing:  Non-adherent dressing   Procedure completion:  Tolerated     Medications Ordered in ED Medications  lidocaine  (PF) (XYLOCAINE ) 1 % injection 10 mL (10 mLs Infiltration Given 04/21/24 1441)    ED  Course/ Medical Decision Making/ A&P                                 Medical Decision Making Amount and/or Complexity of Data Reviewed Radiology: ordered.  Risk Prescription drug management.   This is an overall well-appearing 33 yo female who presents with a laceration of the left middle finger pad with garden  shears.  The wound was explored through full range of motion, there is no evidence of foreign body, fascia rupture, damage to the deep vessels, capillary refill is intact distal to the site of the laceration.  Patient is up-to-date on their tetanus..  They are neurovascularly intact throughout on my exam, intact strength of the affected extremity   Wound was extensively cleaned, and repaired as described above.  Patient tolerated the procedure without difficulty.  Nonadherent dressing was placed.  Patient instructed to monitor for signs of infection including worsening pain, redness, pus draining from the affected site.  Instructed to keep the wound clean, bandage, and change the bandage at least once daily.  Patient understands and agrees to the plan, and is discharged in stable condition at this time. Final Clinical Impression(s) / ED Diagnoses Final diagnoses:  Laceration of left middle finger without foreign body without damage to nail, initial encounter    Rx / DC Orders ED Discharge Orders     None         Stefan Edge 04/21/24 1514    Deatra Face, MD 04/22/24 1501

## 2024-04-21 NOTE — ED Notes (Signed)

## 2024-04-21 NOTE — ED Triage Notes (Signed)
 Pt c/o L middle finger lac after using hedge trimmers. Was seen at [facility] for same, sent to ED "cause he didn't know what to do w it." Bleeding controlled at time of triage, pt reports having finger soaked at UC.   Tetanus UTD
